# Patient Record
Sex: Female | Born: 1995 | Race: Black or African American | Hispanic: No
Health system: Southern US, Community
[De-identification: ages and names within clinical notes are randomized; demographics above are authoritative.]

## PROBLEM LIST (undated history)

## (undated) HISTORY — PX: NO PAST SURGERIES: SHX2092

---

## 2020-03-25 DIAGNOSIS — R87629 Unspecified abnormal cytological findings in specimens from vagina: Secondary | ICD-10-CM

## 2020-03-25 HISTORY — DX: Unspecified abnormal cytological findings in specimens from vagina: R87.629

## 2020-07-25 ENCOUNTER — Emergency Department (HOSPITAL_COMMUNITY): Payer: Self-pay

## 2020-07-25 ENCOUNTER — Other Ambulatory Visit: Payer: Self-pay

## 2020-07-25 ENCOUNTER — Emergency Department (HOSPITAL_COMMUNITY)
Admission: EM | Admit: 2020-07-25 | Discharge: 2020-07-25 | Disposition: A | Discharge status: Home / Self Care | Payer: Self-pay | Attending: Emergency Medicine | Admitting: Emergency Medicine

## 2020-07-25 DIAGNOSIS — T1490XA Injury, unspecified, initial encounter: Secondary | ICD-10-CM

## 2020-07-25 DIAGNOSIS — S61511A Laceration without foreign body of right wrist, initial encounter: Secondary | ICD-10-CM

## 2020-07-25 DIAGNOSIS — S51811A Laceration without foreign body of right forearm, initial encounter: Secondary | ICD-10-CM | POA: Insufficient documentation

## 2020-07-25 DIAGNOSIS — Y907 Blood alcohol level of 200-239 mg/100 ml: Secondary | ICD-10-CM | POA: Insufficient documentation

## 2020-07-25 DIAGNOSIS — F10129 Alcohol abuse with intoxication, unspecified: Secondary | ICD-10-CM | POA: Insufficient documentation

## 2020-07-25 DIAGNOSIS — Z20822 Contact with and (suspected) exposure to covid-19: Secondary | ICD-10-CM | POA: Insufficient documentation

## 2020-07-25 LAB — COMPREHENSIVE METABOLIC PANEL WITH GFR
ALT: 19 U/L (ref 0–44)
AST: 25 U/L (ref 15–41)
Albumin: 4.4 g/dL (ref 3.5–5.0)
Alkaline Phosphatase: 57 U/L (ref 38–126)
Anion gap: 14 (ref 5–15)
BUN: 6 mg/dL (ref 6–20)
CO2: 19 mmol/L — ABNORMAL LOW (ref 22–32)
Calcium: 9.2 mg/dL (ref 8.9–10.3)
Chloride: 108 mmol/L (ref 98–111)
Creatinine, Ser: 0.99 mg/dL (ref 0.44–1.00)
GFR, Estimated: 39 mL/min — ABNORMAL LOW
Glucose, Bld: 146 mg/dL — ABNORMAL HIGH (ref 70–99)
Potassium: 3.2 mmol/L — ABNORMAL LOW (ref 3.5–5.1)
Sodium: 141 mmol/L (ref 135–145)
Total Bilirubin: 0.7 mg/dL (ref 0.3–1.2)
Total Protein: 7.2 g/dL (ref 6.5–8.1)

## 2020-07-25 LAB — I-STAT CHEM 8, ED
BUN: 5 mg/dL — ABNORMAL LOW (ref 6–20)
Calcium, Ion: 0.99 mmol/L — ABNORMAL LOW (ref 1.15–1.40)
Chloride: 108 mmol/L (ref 98–111)
Creatinine, Ser: 1.2 mg/dL — ABNORMAL HIGH (ref 0.44–1.00)
Glucose, Bld: 141 mg/dL — ABNORMAL HIGH (ref 70–99)
HCT: 41 % (ref 36.0–46.0)
Hemoglobin: 13.9 g/dL (ref 12.0–15.0)
Potassium: 3.2 mmol/L — ABNORMAL LOW (ref 3.5–5.1)
Sodium: 143 mmol/L (ref 135–145)
TCO2: 16 mmol/L — ABNORMAL LOW (ref 22–32)

## 2020-07-25 LAB — URINALYSIS, ROUTINE W REFLEX MICROSCOPIC
Bilirubin Urine: NEGATIVE
Glucose, UA: NEGATIVE mg/dL
Ketones, ur: NEGATIVE mg/dL
Leukocytes,Ua: NEGATIVE
Nitrite: NEGATIVE
Protein, ur: NEGATIVE mg/dL
Specific Gravity, Urine: 1.014 (ref 1.005–1.030)
pH: 6 (ref 5.0–8.0)

## 2020-07-25 LAB — CBC
HCT: 42.5 % (ref 36.0–46.0)
Hemoglobin: 13.7 g/dL (ref 12.0–15.0)
MCH: 30.2 pg (ref 26.0–34.0)
MCHC: 32.2 g/dL (ref 30.0–36.0)
MCV: 93.8 fL (ref 80.0–100.0)
Platelets: 246 10*3/uL (ref 150–400)
RBC: 4.53 MIL/uL (ref 3.87–5.11)
RDW: 12.1 % (ref 11.5–15.5)
WBC: 5.4 10*3/uL (ref 4.0–10.5)
nRBC: 0 % (ref 0.0–0.2)

## 2020-07-25 LAB — RESPIRATORY PANEL BY RT PCR (FLU A&B, COVID)
Influenza A by PCR: NEGATIVE
Influenza B by PCR: NEGATIVE
SARS Coronavirus 2 by RT PCR: NEGATIVE

## 2020-07-25 LAB — SAMPLE TO BLOOD BANK

## 2020-07-25 LAB — PROTIME-INR
INR: 1 (ref 0.8–1.2)
Prothrombin Time: 12.7 seconds (ref 11.4–15.2)

## 2020-07-25 LAB — I-STAT BETA HCG BLOOD, ED (MC, WL, AP ONLY): I-stat hCG, quantitative: <5 m[IU]/mL (ref <5)

## 2020-07-25 LAB — ETHANOL: Alcohol, Ethyl (B): 222 mg/dL — ABNORMAL HIGH (ref <10)

## 2020-07-25 LAB — CK: Total CK: 290 U/L — ABNORMAL HIGH (ref 38–234)

## 2020-07-25 LAB — LACTIC ACID, PLASMA: Lactic Acid, Venous: 5.5 mmol/L (ref 0.5–1.9)

## 2020-07-25 MED ORDER — LACTATED RINGERS IV BOLUS
1000.0000 mL | Freq: Once | INTRAVENOUS | Status: AC
  Administered 2020-07-25: 1000 mL via INTRAVENOUS

## 2020-07-25 MED ORDER — IOHEXOL 350 MG/ML SOLN
100.0000 mL | Freq: Once | INTRAVENOUS | Status: AC | PRN
  Administered 2020-07-25: 100 mL via INTRAVENOUS

## 2020-07-25 MED ORDER — KETAMINE HCL 50 MG/5ML IJ SOSY
PREFILLED_SYRINGE | INTRAMUSCULAR | Status: AC
  Administered 2020-07-25: 50 mg
  Filled 2020-07-25: qty 5

## 2020-07-25 MED ORDER — KETAMINE HCL 50 MG/5ML IJ SOSY
PREFILLED_SYRINGE | INTRAMUSCULAR | Status: AC
  Filled 2020-07-25: qty 5

## 2020-07-25 MED ORDER — ZIPRASIDONE MESYLATE 20 MG IM SOLR
10.0000 mg | Freq: Once | INTRAMUSCULAR | Status: AC
  Administered 2020-07-25: 10 mg via INTRAMUSCULAR
  Filled 2020-07-25: qty 20

## 2020-07-25 MED ORDER — SODIUM CHLORIDE 0.9 % IV SOLN: Freq: Once | INTRAVENOUS | Status: AC
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
## 2020-07-25 NOTE — ED Notes (Signed)
Tourniquets removed from pt with MD at bedside. Bleeding controlled.

## 2020-07-25 NOTE — ED Notes (Signed)
Patient transported to CT 

## 2020-07-25 NOTE — ED Notes (Signed)
MD at bedside, suturing lacerations on R arm

## 2020-07-25 NOTE — ED Notes (Signed)
Per EMS, first tourniquet applied at 0145 by [employer]. Second tourniquet applied by EMS at 0205.

## 2020-08-01 ENCOUNTER — Encounter (HOSPITAL_COMMUNITY): Payer: Self-pay | Admitting: *Deleted

## 2020-08-01 ENCOUNTER — Other Ambulatory Visit: Payer: Self-pay

## 2020-08-01 ENCOUNTER — Ambulatory Visit (HOSPITAL_COMMUNITY)
Admission: EM | Admit: 2020-08-01 | Discharge: 2020-08-01 | Disposition: A | Discharge status: Home / Self Care | Payer: Self-pay | Attending: Family Medicine | Admitting: Family Medicine

## 2020-08-01 DIAGNOSIS — S4991XD Unspecified injury of right shoulder and upper arm, subsequent encounter: Secondary | ICD-10-CM

## 2020-08-01 DIAGNOSIS — R200 Anesthesia of skin: Secondary | ICD-10-CM

## 2020-08-01 DIAGNOSIS — S61511D Laceration without foreign body of right wrist, subsequent encounter: Secondary | ICD-10-CM

## 2020-08-01 LAB — POC URINE PREG, ED: Preg Test, Ur: NEGATIVE

## 2020-08-01 MED ORDER — PREDNISONE 20 MG PO TABS: 20.0000 mg | ORAL_TABLET | Freq: Two times a day (BID) | ORAL | 0 refills | Status: DC
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
## 2020-08-01 NOTE — Discharge Instructions (Signed)
Continue to keep area clean and dry.  Stitches need to come out on 08/04/2020 Call tomorrow to [facility].  Dr. Dorthula Nettles is on-call for hand injuries.  You need to be seen next week Take prednisone twice a day.  This is an anti-inflammatory to help take down swelling around the nerve Keep hand elevated as much as you are able

## 2020-08-07 ENCOUNTER — Other Ambulatory Visit (HOSPITAL_COMMUNITY): Payer: Self-pay

## 2020-08-11 ENCOUNTER — Encounter (HOSPITAL_BASED_OUTPATIENT_CLINIC_OR_DEPARTMENT_OTHER): Admission: RE | Payer: Self-pay | Source: Home / Self Care

## 2020-08-11 ENCOUNTER — Ambulatory Visit (HOSPITAL_BASED_OUTPATIENT_CLINIC_OR_DEPARTMENT_OTHER): Admission: RE | Admit: 2020-08-11 | Payer: 59 | Source: Home / Self Care | Admitting: Orthopedic Surgery

## 2020-08-11 SURGERY — REPAIR, TENDON, FLEXOR
Anesthesia: Monitor Anesthesia Care | Laterality: Right

## 2021-03-25 DIAGNOSIS — Z9189 Other specified personal risk factors, not elsewhere classified: Secondary | ICD-10-CM | POA: Insufficient documentation

## 2021-03-29 ENCOUNTER — Encounter (HOSPITAL_COMMUNITY): Payer: Self-pay

## 2021-03-29 ENCOUNTER — Emergency Department (HOSPITAL_COMMUNITY)
Admission: EM | Admit: 2021-03-29 | Discharge: 2021-03-29 | Disposition: A | Discharge status: Home / Self Care | Payer: 59 | Attending: Emergency Medicine | Admitting: Emergency Medicine

## 2021-03-29 ENCOUNTER — Other Ambulatory Visit: Payer: Self-pay

## 2021-03-29 DIAGNOSIS — T50901A Poisoning by unspecified drugs, medicaments and biological substances, accidental (unintentional), initial encounter: Secondary | ICD-10-CM

## 2021-03-29 DIAGNOSIS — R112 Nausea with vomiting, unspecified: Secondary | ICD-10-CM | POA: Insufficient documentation

## 2021-03-29 DIAGNOSIS — F17200 Nicotine dependence, unspecified, uncomplicated: Secondary | ICD-10-CM | POA: Insufficient documentation

## 2021-03-29 DIAGNOSIS — D72829 Elevated white blood cell count, unspecified: Secondary | ICD-10-CM | POA: Insufficient documentation

## 2021-03-29 DIAGNOSIS — T424X1A Poisoning by benzodiazepines, accidental (unintentional), initial encounter: Secondary | ICD-10-CM | POA: Insufficient documentation

## 2021-03-29 LAB — RAPID URINE DRUG SCREEN, HOSP PERFORMED
Amphetamines: NOT DETECTED
Barbiturates: NOT DETECTED
Benzodiazepines: NOT DETECTED
Cocaine: NOT DETECTED
Opiates: NOT DETECTED
Tetrahydrocannabinol: POSITIVE — AB

## 2021-03-29 LAB — COMPREHENSIVE METABOLIC PANEL
ALT: 19 U/L (ref 0–44)
AST: 29 U/L (ref 15–41)
Albumin: 4.8 g/dL (ref 3.5–5.0)
Alkaline Phosphatase: 76 U/L (ref 38–126)
Anion gap: 14 (ref 5–15)
BUN: 9 mg/dL (ref 6–20)
CO2: 17 mmol/L — ABNORMAL LOW (ref 22–32)
Calcium: 10 mg/dL (ref 8.9–10.3)
Chloride: 104 mmol/L (ref 98–111)
Creatinine, Ser: 1.15 mg/dL — ABNORMAL HIGH (ref 0.44–1.00)
GFR, Estimated: >60 mL/min (ref >60)
Glucose, Bld: 183 mg/dL — ABNORMAL HIGH (ref 70–99)
Potassium: 3.4 mmol/L — ABNORMAL LOW (ref 3.5–5.1)
Sodium: 135 mmol/L (ref 135–145)
Total Bilirubin: 2.6 mg/dL — ABNORMAL HIGH (ref 0.3–1.2)
Total Protein: 7.8 g/dL (ref 6.5–8.1)

## 2021-03-29 LAB — CBC WITH DIFFERENTIAL/PLATELET
Abs Immature Granulocytes: 0.08 10*3/uL — ABNORMAL HIGH (ref 0.00–0.07)
Basophils Absolute: 0.1 10*3/uL (ref 0.0–0.1)
Basophils Relative: 0 %
Eosinophils Absolute: 0 10*3/uL (ref 0.0–0.5)
Eosinophils Relative: 0 %
HCT: 45.9 % (ref 36.0–46.0)
Hemoglobin: 15.4 g/dL — ABNORMAL HIGH (ref 12.0–15.0)
Immature Granulocytes: 1 %
Lymphocytes Relative: 9 %
Lymphs Abs: 1.1 10*3/uL (ref 0.7–4.0)
MCH: 30.3 pg (ref 26.0–34.0)
MCHC: 33.6 g/dL (ref 30.0–36.0)
MCV: 90.2 fL (ref 80.0–100.0)
Monocytes Absolute: 1.1 10*3/uL — ABNORMAL HIGH (ref 0.1–1.0)
Monocytes Relative: 9 %
Neutro Abs: 10 10*3/uL — ABNORMAL HIGH (ref 1.7–7.7)
Neutrophils Relative %: 81 %
Platelets: 285 10*3/uL (ref 150–400)
RBC: 5.09 MIL/uL (ref 3.87–5.11)
RDW: 12.1 % (ref 11.5–15.5)
WBC: 12.3 10*3/uL — ABNORMAL HIGH (ref 4.0–10.5)
nRBC: 0 % (ref 0.0–0.2)

## 2021-03-29 LAB — BASIC METABOLIC PANEL
Anion gap: 10 (ref 5–15)
BUN: 6 mg/dL (ref 6–20)
CO2: 18 mmol/L — ABNORMAL LOW (ref 22–32)
Calcium: 8.9 mg/dL (ref 8.9–10.3)
Chloride: 109 mmol/L (ref 98–111)
Creatinine, Ser: 0.77 mg/dL (ref 0.44–1.00)
GFR, Estimated: >60 mL/min (ref >60)
Glucose, Bld: 86 mg/dL (ref 70–99)
Potassium: 4 mmol/L (ref 3.5–5.1)
Sodium: 137 mmol/L (ref 135–145)

## 2021-03-29 LAB — I-STAT BETA HCG BLOOD, ED (MC, WL, AP ONLY): I-stat hCG, quantitative: <5 m[IU]/mL (ref <5)

## 2021-03-29 LAB — MAGNESIUM: Magnesium: 2.1 mg/dL (ref 1.7–2.4)

## 2021-03-29 LAB — SALICYLATE LEVEL: Salicylate Lvl: <7 mg/dL — ABNORMAL LOW (ref 7.0–30.0)

## 2021-03-29 LAB — ETHANOL: Alcohol, Ethyl (B): <10 mg/dL (ref <10)

## 2021-03-29 LAB — ACETAMINOPHEN LEVEL: Acetaminophen (Tylenol), Serum: <10 ug/mL — ABNORMAL LOW (ref 10–30)

## 2021-03-29 MED ORDER — SODIUM CHLORIDE 0.9 % IV BOLUS
1000.0000 mL | Freq: Once | INTRAVENOUS | Status: AC
  Administered 2021-03-29: 18:00:00 1000 mL via INTRAVENOUS

## 2021-03-29 MED ORDER — ONDANSETRON HCL 4 MG/2ML IJ SOLN
4.0000 mg | Freq: Once | INTRAMUSCULAR | Status: AC
  Administered 2021-03-29: 18:00:00 4 mg via INTRAVENOUS
  Filled 2021-03-29: qty 2

## 2021-03-29 MED ORDER — POTASSIUM CHLORIDE 10 MEQ/100ML IV SOLN
10.0000 meq | Freq: Once | INTRAVENOUS | Status: AC
  Administered 2021-03-29: 18:00:00 10 meq via INTRAVENOUS
  Filled 2021-03-29: qty 100

## 2021-03-29 NOTE — ED Provider Notes (Addendum)
Emergency Medicine Provider Triage Evaluation Note  Lacey Melendez , a 25 y.o. female  was evaluated in triage.  Pt complains of possible overdose after taking a handful of Lexapro which she is currently on for depression and anxiety.  Symptoms include weakness, nausea, vomiting, "eyes are numb "..  Review of Systems  Positive: Vomiting, nausea, weakness Negative: Chest pain  Physical Exam  There were no vitals taken for this visit. Gen:   Awake, no distress   Resp:  Normal effort  MSK:   Moves extremities without difficulty  Other:    Medical Decision Making  Medically screening exam initiated at 1:20 PM.  Appropriate orders placed.  Lacey Melendez was informed that the remainder of the evaluation will be completed by another provider, this initial triage assessment does not replace that evaluation, and the importance of remaining in the ED until their evaluation is complete.  Will need to call poison control. EKG ordered.    Claude Manges, PA-C 03/29/21 1323    [address], PA-C 03/29/21 1327    Milagros Loll, MD 03/30/21 [phone]

## 2021-03-29 NOTE — ED Notes (Signed)
Spoke with poison control. They are recommending a repeat BMP and EKG.

## 2021-03-29 NOTE — ED Notes (Signed)
Pt ambulated to restroom w/o incident

## 2021-03-29 NOTE — ED Triage Notes (Addendum)
Patient arrived by [facility] with complaint of taking escitalopram(lexapro) as an accident and thought she took a handful of candy. Patient alert and oriented. Has vomited x 1 pta with pill fragments in bag. Patient has no idea how many pills she took.

## 2021-03-29 NOTE — ED Provider Notes (Signed)
[facility] EMERGENCY DEPARTMENT Provider Note   CSN: [id number] Arrival date & time: 03/29/21  1250     History No chief complaint on file.   Lacey Melendez is a 25 y.o. female who presents for evaluation of possible overdose.  Patient reports that about 1230 this afternoon, she took a handful of her Lexapro.  Patient reports she has the pills in a day and she thought it was candy.  She states she grabbed a handful and took them.  She states she takes 10 mg Lexapro.  She thinks she may have taken about 20.  She states that this was accidental and she was not trying to hurt or kill herself.  She does report smoking marijuana earlier this morning.  No alcohol, cocaine, other drug use.  She reports that after this happened, she had a few episodes of nausea/vomiting.  She states her abdomen has felt "queasy."  No chest pain, trouble breathing.  She is not any difficulty moving her legs.  The history is provided by the patient.      History reviewed. No pertinent past medical history.  There are no problems to display for this patient.   History reviewed. No pertinent surgical history.   OB History   No obstetric history on file.     No family history on file.  Social History   Tobacco Use   Smoking status: Every Day    Pack years: 0.00   Smokeless tobacco: Never  Vaping Use   Vaping Use: Some days  Substance Use Topics   Drug use: Yes    Types: Marijuana    Comment: EVERY DAY    Home Medications Prior to Admission medications   Medication Sig Start Date End Date Taking? Authorizing Provider  predniSONE (DELTASONE) 20 MG tablet Take 1 tablet (20 mg total) by mouth 2 (two) times daily with a meal. 08/01/20   Eustace Moore, MD    Allergies    Patient has no known allergies.  Review of Systems   Review of Systems  Constitutional:  Negative for fever.  Respiratory:  Negative for cough and shortness of breath.   Cardiovascular:  Negative for  chest pain.  Gastrointestinal:  Positive for nausea and vomiting. Negative for abdominal pain.  Genitourinary:  Negative for dysuria and hematuria.  Neurological:  Negative for weakness, numbness and headaches.  Psychiatric/Behavioral:  Negative for suicidal ideas.   All other systems reviewed and are negative.  Physical Exam Updated Vital Signs BP 114/78 (BP Location: Right Arm)   Pulse 61   Temp 98.2 F (36.8 C) (Oral)   Resp 16   Ht 5\' 5"  (1.651 m)   Wt 59.9 kg   SpO2 100%   BMI 21.97 kg/m   Physical Exam Vitals and nursing note reviewed.  Constitutional:      Appearance: Normal appearance. She is well-developed.  HENT:     Head: Normocephalic and atraumatic.  Eyes:     General: Lids are normal.     Conjunctiva/sclera: Conjunctivae normal.     Pupils: Pupils are equal, round, and reactive to light.  Cardiovascular:     Rate and Rhythm: Normal rate and regular rhythm.     Pulses: Normal pulses.     Heart sounds: Normal heart sounds. No murmur heard.   No friction rub. No gallop.  Pulmonary:     Effort: Pulmonary effort is normal.     Breath sounds: Normal breath sounds.     Comments:  Lungs clear to auscultation bilaterally.  Symmetric chest rise.  No wheezing, rales, rhonchi. Abdominal:     Palpations: Abdomen is soft. Abdomen is not rigid.     Tenderness: There is no abdominal tenderness. There is no guarding.     Comments: Abdomen is soft, non-distended, non-tender. No rigidity, No guarding. No peritoneal signs.  Musculoskeletal:        General: Normal range of motion.     Cervical back: Full passive range of motion without pain.  Skin:    General: Skin is warm and dry.     Capillary Refill: Capillary refill takes less than 2 seconds.  Neurological:     Mental Status: She is alert and oriented to person, place, and time.  Psychiatric:        Speech: Speech normal.    ED Results / Procedures / Treatments   Labs (all labs ordered are listed, but only  abnormal results are displayed) Labs Reviewed  CBC WITH DIFFERENTIAL/PLATELET - Abnormal; Notable for the following components:      Result Value   WBC 12.3 (*)    Hemoglobin 15.4 (*)    Neutro Abs 10.0 (*)    Monocytes Absolute 1.1 (*)    Abs Immature Granulocytes 0.08 (*)    All other components within normal limits  COMPREHENSIVE METABOLIC PANEL - Abnormal; Notable for the following components:   Potassium 3.4 (*)    CO2 17 (*)    Glucose, Bld 183 (*)    Creatinine, Ser 1.15 (*)    Total Bilirubin 2.6 (*)    All other components within normal limits  RAPID URINE DRUG SCREEN, HOSP PERFORMED - Abnormal; Notable for the following components:   Tetrahydrocannabinol POSITIVE (*)    All other components within normal limits  SALICYLATE LEVEL - Abnormal; Notable for the following components:   Salicylate Lvl <7.0 (*)    All other components within normal limits  ACETAMINOPHEN LEVEL - Abnormal; Notable for the following components:   Acetaminophen (Tylenol), Serum <10 (*)    All other components within normal limits  ETHANOL  MAGNESIUM  CBC WITH DIFFERENTIAL/PLATELET  I-STAT BETA HCG BLOOD, ED ([facility], [facility], [facility] ONLY)  CBG MONITORING, ED    EKG EKG Interpretation  Date/Time:  Tuesday March 29 2021 13:26:03 EDT Ventricular Rate:  86 PR Interval:  138 QRS Duration: 72 QT Interval:  398 QTC Calculation: 476 R Axis:   32 Text Interpretation: Normal sinus rhythm Right atrial enlargement Borderline ECG No old tracing to compare Confirmed by Jacalyn Lefevre [phone]) on 03/29/2021 4:54:56 PM  Radiology No results found.  Procedures Procedures   Medications Ordered in ED Medications  potassium chloride 10 mEq in 100 mL IVPB (10 mEq Intravenous New Bag/Given 03/29/21 1744)  ondansetron (ZOFRAN) injection 4 mg (4 mg Intravenous Given 03/29/21 1741)  sodium chloride 0.9 % bolus 1,000 mL (1,000 mLs Intravenous New Bag/Given 03/29/21 1741)    ED Course  I have reviewed the triage vital signs  and the nursing notes.  Pertinent labs & imaging results that were available during my care of the patient were reviewed by me and considered in my medical decision making (see chart for details).  Clinical Course as of 03/29/21 1846  Tue Mar 29, 2021  1654 Potassium(!): 3.4 [LL]    Clinical Course User Index [LL] Rosana Hoes   MDM Rules/Calculators/A&P  25 year old female who presents for evaluation of possible overdose.  Patient reports that about 12:30 PM, she took a handful of her Lexapro that she thought was candy and ingested them.  She states that this was accidental and she was not having any SI.  She states she takes 10 mg Lexapro and states that she may have taken about 20 of them.  On initial arrival, she is afebrile, toxic appearing.  Vital signs are stable.  Exam is reassuring.  Patient tells me that this was accidental and she does not have any SI.  I cannot see any Lexapro listed on her medication list but patient states she takes it for anxiety/depression.  CBC shows slight leukocytosis of 12.3.  Hemoglobin is 15.4.  I-STAT beta is negative.  CMP shows potassium 3.4, bicarb of 17, glucose of 183, creatinine of 1.15.  Ethanol, salicylate, acetaminophen level are normal.  UDS is positive for marijuana.  Discussed patient with Misty Stanley [facility]) who states that patient took less than 300 mg, she will monitor for 10 hours.  The patient took more than 300 mg, they recommend observation for 24 hours.  They would like a magnesium level checked given her potassium is 3.4 as well as potassium repletion here in the ED.  They recommend IV fluids, supportive care measures.  Patient will need a repeat EKG prior to discharge.  She states the main concerns for Lexapro overdose are QTC prolongation, QRS prolongation, CNS depression, nausea/vomiting.  Discussed plan with patient.  Patient thinks that she only took 20.  Will monitor for 10 hours.  Patient  signed out to Dr. Particia Nearing with continued observation.   Portions of this note were generated with Scientist, clinical (histocompatibility and immunogenetics). Dictation errors may occur despite best attempts at proofreading.    Final Clinical Impression(s) / ED Diagnoses Final diagnoses:  Accidental overdose, initial encounter    Rx / DC Orders ED Discharge Orders     None        Rosana Hoes 03/29/21 1846    Jacalyn Lefevre, MD 03/29/21 2307

## 2021-03-29 NOTE — ED Notes (Signed)
Pt wants labs to be drawn off IV.

## 2021-08-02 ENCOUNTER — Other Ambulatory Visit: Payer: Self-pay

## 2021-08-02 ENCOUNTER — Encounter: Payer: Self-pay | Admitting: Obstetrics and Gynecology

## 2021-08-02 ENCOUNTER — Ambulatory Visit (INDEPENDENT_AMBULATORY_CARE_PROVIDER_SITE_OTHER): Payer: Self-pay | Admitting: Obstetrics and Gynecology

## 2021-08-02 ENCOUNTER — Other Ambulatory Visit (HOSPITAL_COMMUNITY)
Admission: RE | Admit: 2021-08-02 | Discharge: 2021-08-02 | Disposition: A | Discharge status: Home / Self Care | Payer: Medicaid Other | Source: Ambulatory Visit | Attending: Obstetrics and Gynecology | Admitting: Obstetrics and Gynecology

## 2021-08-02 DIAGNOSIS — Z3400 Encounter for supervision of normal first pregnancy, unspecified trimester: Secondary | ICD-10-CM | POA: Insufficient documentation

## 2021-08-02 DIAGNOSIS — Z3403 Encounter for supervision of normal first pregnancy, third trimester: Secondary | ICD-10-CM | POA: Insufficient documentation

## 2021-08-02 DIAGNOSIS — O99322 Drug use complicating pregnancy, second trimester: Secondary | ICD-10-CM

## 2021-08-02 DIAGNOSIS — Z3401 Encounter for supervision of normal first pregnancy, first trimester: Secondary | ICD-10-CM | POA: Insufficient documentation

## 2021-08-02 DIAGNOSIS — O99320 Drug use complicating pregnancy, unspecified trimester: Secondary | ICD-10-CM | POA: Insufficient documentation

## 2021-08-02 DIAGNOSIS — Z9189 Other specified personal risk factors, not elsewhere classified: Secondary | ICD-10-CM

## 2021-08-02 MED ORDER — BLOOD PRESSURE MONITOR MISC: 0 refills | Status: AC

## 2021-08-02 NOTE — Patient Instructions (Signed)

## 2021-08-02 NOTE — Progress Notes (Signed)
  Subjective:    Lacey Melendez is a G1P0 at [redacted]w[redacted]d being seen today for her first obstetrical visit.  Her obstetrical history is significant for late onset to prenatal care. Patient does intend to breast feed. Pregnancy history fully reviewed.  Patient reports no complaints.  Vitals:   08/02/21 1430  BP: 112/73  Pulse: 80  Weight: 134 lb (60.8 kg)    HISTORY: OB History  Gravida Para Term Preterm AB Living  1            SAB IAB Ectopic Multiple Live Births               # Outcome Date GA Lbr Len/2nd Weight Sex Delivery Anes PTL Lv  1 Current            Past Medical History:  Diagnosis Date  . Vaginal Pap smear, abnormal 03/2020   high hpv   History reviewed. No pertinent surgical history. Family History  Problem Relation Age of Onset  . Hyperlipidemia Maternal Grandmother   . Diabetes Paternal Grandmother      Exam    Uterus:     Pelvic Exam:    Perineum: No Hemorrhoids, Normal Perineum   Vulva: normal   Vagina:  normal mucosa, normal discharge   pH:    Cervix: multiparous appearance and cervix is closed and long   Adnexa: normal adnexa and no mass, fullness, tenderness   Bony Pelvis: gynecoid  System: Breast:  normal appearance, no masses or tenderness   Skin: normal coloration and turgor, no rashes    Neurologic: oriented, no focal deficits   Extremities: normal strength, tone, and muscle mass   HEENT extra ocular movement intact   Mouth/Teeth mucous membranes moist, pharynx normal without lesions and dental hygiene good   Neck supple and no masses   Cardiovascular: regular rate and rhythm   Respiratory:  appears well, vitals normal, no respiratory distress, acyanotic, normal RR, chest clear, no wheezing, crepitations, rhonchi, normal symmetric air entry   Abdomen: soft, non-tender; bowel sounds normal; no masses,  no organomegaly   Urinary:       Assessment:    Pregnancy: G1P0 Patient Active Problem List   Diagnosis Date Noted  . Supervision of  normal first pregnancy, antepartum 08/02/2021  . Drug use affecting pregnancy 08/02/2021  . Encounter for supervision of normal first pregnancy in first trimester 08/02/2021  . History of drug overdose 03/25/2021        Plan:     Initial labs drawn. Prenatal vitamins. Problem list reviewed and updated. Genetic Screening discussed : panorama deferred.  Ultrasound discussed; fetal survey: ordered.  Follow up in 4 weeks. 50% of 30 min visit spent on counseling and coordination of care.     Nguyet Mercer 08/02/2021

## 2021-08-03 LAB — CERVICOVAGINAL ANCILLARY ONLY
Bacterial Vaginitis (gardnerella): NEGATIVE
Candida Glabrata: NEGATIVE
Candida Vaginitis: NEGATIVE
Chlamydia: NEGATIVE
Comment: NEGATIVE
Comment: NEGATIVE
Comment: NEGATIVE
Comment: NEGATIVE
Comment: NEGATIVE
Comment: NORMAL
Neisseria Gonorrhea: NEGATIVE
Trichomonas: NEGATIVE

## 2021-08-04 LAB — CYTOLOGY - PAP: Diagnosis: NEGATIVE

## 2021-08-04 LAB — CULTURE, OB URINE

## 2021-08-04 LAB — URINE CULTURE, OB REFLEX: Organism ID, Bacteria: NO GROWTH

## 2021-08-05 LAB — OBSTETRIC PANEL, INCLUDING HIV
Antibody Screen: NEGATIVE
Basophils Absolute: 0 10*3/uL (ref 0.0–0.2)
Basos: 0 %
EOS (ABSOLUTE): 0.1 10*3/uL (ref 0.0–0.4)
Eos: 1 %
HIV Screen 4th Generation wRfx: NONREACTIVE
Hematocrit: 36.8 % (ref 34.0–46.6)
Hemoglobin: 12.7 g/dL (ref 11.1–15.9)
Hepatitis B Surface Ag: NEGATIVE
Immature Grans (Abs): 0 10*3/uL (ref 0.0–0.1)
Immature Granulocytes: 1 %
Lymphocytes Absolute: 1.1 10*3/uL (ref 0.7–3.1)
Lymphs: 14 %
MCH: 30.3 pg (ref 26.6–33.0)
MCHC: 34.5 g/dL (ref 31.5–35.7)
MCV: 88 fL (ref 79–97)
Monocytes Absolute: 0.6 10*3/uL (ref 0.1–0.9)
Monocytes: 7 %
Neutrophils Absolute: 5.8 10*3/uL (ref 1.4–7.0)
Neutrophils: 77 %
Platelets: 228 10*3/uL (ref 150–450)
RBC: 4.19 x10E6/uL (ref 3.77–5.28)
RDW: 12.5 % (ref 11.7–15.4)
RPR Ser Ql: NONREACTIVE
Rh Factor: POSITIVE
Rubella Antibodies, IGG: 8.4 index (ref >0.99)
WBC: 7.6 10*3/uL (ref 3.4–10.8)

## 2021-08-24 ENCOUNTER — Ambulatory Visit: Payer: Medicaid Other | Attending: Obstetrics and Gynecology

## 2021-08-24 ENCOUNTER — Other Ambulatory Visit: Payer: Self-pay

## 2021-08-24 DIAGNOSIS — Z363 Encounter for antenatal screening for malformations: Secondary | ICD-10-CM | POA: Insufficient documentation

## 2021-08-24 DIAGNOSIS — Z3400 Encounter for supervision of normal first pregnancy, unspecified trimester: Secondary | ICD-10-CM

## 2021-08-24 DIAGNOSIS — Z3402 Encounter for supervision of normal first pregnancy, second trimester: Secondary | ICD-10-CM | POA: Insufficient documentation

## 2021-08-25 ENCOUNTER — Other Ambulatory Visit: Payer: Self-pay | Admitting: *Deleted

## 2021-08-25 DIAGNOSIS — Z362 Encounter for other antenatal screening follow-up: Secondary | ICD-10-CM

## 2021-08-30 ENCOUNTER — Encounter: Payer: Self-pay | Admitting: Nurse Practitioner

## 2021-08-30 ENCOUNTER — Other Ambulatory Visit: Payer: Self-pay

## 2021-08-30 ENCOUNTER — Ambulatory Visit (INDEPENDENT_AMBULATORY_CARE_PROVIDER_SITE_OTHER): Payer: Medicaid Other | Admitting: Nurse Practitioner

## 2021-08-30 VITALS — BP 103/64 | HR 83 | Wt 140.0 lb

## 2021-08-30 DIAGNOSIS — O26899 Other specified pregnancy related conditions, unspecified trimester: Secondary | ICD-10-CM

## 2021-08-30 DIAGNOSIS — G5600 Carpal tunnel syndrome, unspecified upper limb: Secondary | ICD-10-CM

## 2021-08-30 DIAGNOSIS — Z3A21 21 weeks gestation of pregnancy: Secondary | ICD-10-CM

## 2021-08-30 DIAGNOSIS — Z3401 Encounter for supervision of normal first pregnancy, first trimester: Secondary | ICD-10-CM

## 2021-08-30 DIAGNOSIS — Z9189 Other specified personal risk factors, not elsewhere classified: Secondary | ICD-10-CM

## 2021-08-30 NOTE — Progress Notes (Signed)
Pt is having carpal like symptoms, recommend wrist spints/Tylenol.

## 2021-08-30 NOTE — Progress Notes (Signed)
    Subjective:  Lacey Melendez is a 25 y.o. G1P0 at [redacted]w[redacted]d being seen today for ongoing prenatal care.  She is currently monitored for the following issues for this low-risk pregnancy and has Supervision of normal first pregnancy, antepartum; Drug use affecting pregnancy; Encounter for supervision of normal first pregnancy in first trimester; and History of drug overdose on their problem list.  Patient reports  hands asleep on awakening .  Contractions: Not present. Vag. Bleeding: None.  Movement: Present. Denies leaking of fluid.   The following portions of the patient's history were reviewed and updated as appropriate: allergies, current medications, past family history, past medical history, past social history, past surgical history and problem list. Problem list updated.  Objective:   Vitals:   08/30/21 1328  BP: 103/64  Pulse: 83  Weight: 140 lb (63.5 kg)    Fetal Status: Fetal Heart Rate (bpm): 145   Movement: Present     General:  Alert, oriented and cooperative. Patient is in no acute distress.  Skin: Skin is warm and dry. No rash noted.   Cardiovascular: Normal heart rate noted  Respiratory: Normal respiratory effort, no problems with respiration noted  Abdomen: Soft, gravid, appropriate for gestational age. Pain/Pressure: Absent     Pelvic:  Cervical exam deferred        Extremities: Normal range of motion.     Mental Status: Normal mood and affect. Normal behavior. Normal judgment and thought content.   Urinalysis:      Assessment and Plan:  Pregnancy: G1P0 at [redacted]w[redacted]d  1. Encounter for supervision of normal first pregnancy in first trimester Reviewed EDC confirmed by MFM Drawing genetic testing and AFP today Will pick up BP cuff today - advised to take BP once a week and enter into Babyscripts Reviewed BP that is too high  Feeling baby move infrequently - advised she will feel daily movement by 24-28 weeks  - AFP, Serum, Open Spina Bifida  2. Carpal tunnel  syndrome during pregnancy Recommended hard wrist splints at night  3. History of drug overdose Does not see therapist who prescribed Lexapro.  Advised with a previous history, follow up in pregnancy is recommended to connect with [facility] in case problems arise in pregnancy or after birth.  - Ambulatory referral to Integrated [facility]  4. [redacted] weeks gestation of pregnancy   Preterm labor symptoms and general obstetric precautions including but not limited to vaginal bleeding, contractions, leaking of fluid and fetal movement were reviewed in detail with the patient. Please refer to After Visit Summary for other counseling recommendations.  Return in about 4 weeks (around 09/27/2021) for virtual or in person ROB.  Nolene Bernheim, RN, MSN, NP-BC Nurse Practitioner, [facility] for [employer], [facility] 08/30/2021 2:07 PM

## 2021-09-01 LAB — AFP, SERUM, OPEN SPINA BIFIDA
AFP MoM: 0.91
AFP Value: 73 ng/mL
Gest. Age on Collection Date: 21.2 weeks
Maternal Age At EDD: 27.7 yr
OSBR Risk 1 IN: 10000
Test Results:: NEGATIVE
Weight: 140 [lb_av]

## 2021-09-06 ENCOUNTER — Encounter: Payer: Self-pay | Admitting: Nurse Practitioner

## 2021-09-08 ENCOUNTER — Encounter: Payer: Self-pay | Admitting: Nurse Practitioner

## 2021-09-12 ENCOUNTER — Encounter: Payer: Self-pay | Admitting: Obstetrics and Gynecology

## 2021-09-22 ENCOUNTER — Ambulatory Visit: Payer: Medicaid Other | Attending: Obstetrics

## 2021-09-22 ENCOUNTER — Ambulatory Visit: Payer: Medicaid Other | Admitting: *Deleted

## 2021-09-22 ENCOUNTER — Other Ambulatory Visit: Payer: Self-pay

## 2021-09-22 VITALS — BP 114/56 | HR 83

## 2021-09-22 DIAGNOSIS — F1210 Cannabis abuse, uncomplicated: Secondary | ICD-10-CM | POA: Insufficient documentation

## 2021-09-22 DIAGNOSIS — Z3A24 24 weeks gestation of pregnancy: Secondary | ICD-10-CM

## 2021-09-22 DIAGNOSIS — O99322 Drug use complicating pregnancy, second trimester: Secondary | ICD-10-CM | POA: Diagnosis not present

## 2021-09-22 DIAGNOSIS — Z362 Encounter for other antenatal screening follow-up: Secondary | ICD-10-CM | POA: Diagnosis not present

## 2021-09-25 NOTE — L&D Delivery Note (Signed)
Delivery Note ?Lacey Melendez is a 26 y.o. G1P0 at [redacted]w[redacted]d admitted for active labor.  ? ?GBS Status:  Negative/-- (03/29 1458) ?Maximum Maternal Temperature: 98.6 ? ?Labor course: Initial SVE: 6/80/-1. Augmentation with: N/A. She then progressed to complete.  ?ROM: rupture date, rupture time, delivery date, or delivery time have not been documented with clear fluid ? ?Birth: At 1257 a viable female was delivered via spontaneous vaginal delivery (Presentation: cephalic;ROA). Nuchal cord present: Yes, reduced and delivered through. Shoulders and body delivered in usual fashion. Infant placed directly on mom's abdomen for bonding/skin-to-skin, baby dried and stimulated. Cord clamped x 2 after 1 minute and cut by FOB-Byron. Cord blood collected.  The placenta separated spontaneously and delivered via gentle cord traction. Pitocin infused rapidly IV per protocol.  Fundus firm with massage.  ?Placenta inspected and appears to be intact with a 3 VC.  Placenta/Cord with the following complications: n/a. Cord pH: n/a ?Sponge and instrument count were correct x2. ? ?Intrapartum complications:  None ?Anesthesia:  none ?Episiotomy: none ?Lacerations:  1st degree, hemostatic not repaired ?Suture Repair:  n/a ?EBL (mL): 100 ? ? ?Infant: ?APGAR (1 MIN): 9   ?APGAR (5 MINS): 9    ?Infant weight: pending ? ?Mom to postpartum.  Baby to Couplet care / Skin to Skin. Placenta to L&D   ?Plans to Breast and bottlefeed ?Contraception: Nexplanon ?Circumcision: N/A ? ?Note sent to [facility] for pp visit. ? ? ?Renee Harder CNM ?12/30/2021 ?1:41 PM ? ? ? ?

## 2021-09-26 ENCOUNTER — Telehealth: Payer: Medicaid Other | Admitting: Physician Assistant

## 2021-09-26 DIAGNOSIS — J069 Acute upper respiratory infection, unspecified: Secondary | ICD-10-CM

## 2021-09-26 NOTE — Patient Instructions (Signed)
Lacey Melendez, thank you for joining Mar Daring, PA-C for today's virtual visit.  While this provider is not your primary care provider (PCP), if your PCP is located in our provider database this encounter information will be shared with them immediately following your visit.  Consent: (Patient) Lacey Melendez provided verbal consent for this virtual visit at the beginning of the encounter.  Current Medications:  Current Outpatient Medications:    Blood Pressure Monitor MISC, Please  check blood pressure 1-2 times per week, Disp: 1 each, Rfl: 0   prenatal vitamin w/FE, FA (PRENATAL 1 + 1) 27-1 MG TABS tablet, Take 1 tablet by mouth daily at 12 noon., Disp: , Rfl:    Medications ordered in this encounter:  No orders of the defined types were placed in this encounter.    *If you need refills on other medications prior to your next appointment, please contact your pharmacy*  Follow-Up: Call back or seek an in-person evaluation if the symptoms worsen or if the condition fails to improve as anticipated.  Other Instructions Common Medications Safe in Pregnancy  Acne:      Constipation:  Benzoyl Peroxide     Colace  Clindamycin      Dulcolax Suppository  Topica Erythromycin     Fibercon  Salicylic Acid      Metamucil         Miralax AVOID:        Senakot   Accutane    Cough:  Retin-A       Cough Drops  Tetracycline      Phenergan w/ Codeine if Rx  Minocycline      Robitussin (Plain & DM)  Antibiotics:     Crabs/Lice:  Ceclor       RID  Cephalosporins    AVOID:  E-Mycins      Kwell  Keflex  Macrobid/Macrodantin   Diarrhea:  Penicillin      Kao-Pectate  Zithromax      Imodium AD         PUSH FLUIDS AVOID:       Cipro     Fever:  Tetracycline      Tylenol (Regular or Extra  Minocycline       Strength)  Levaquin      Extra Strength-Do not          Exceed 8 tabs/24 hrs Caffeine:        <226m/day (equiv. To 1 cup of coffee or  approx. 3 12 oz  sodas)         Gas: Cold/Hayfever:       Gas-X  Benadryl      Mylicon  Claritin       Phazyme  **Claritin-D        Chlor-Trimeton    Headaches:  Dimetapp      ASA-Free Excedrin  Drixoral-Non-Drowsy     Cold Compress  Mucinex (Guaifenasin)     Tylenol (Regular or Extra  Sudafed/Sudafed-12 Hour     Strength)  **Sudafed PE Pseudoephedrine   Tylenol Cold & Sinus     Vicks Vapor Rub  Zyrtec  **AVOID if Problems With Blood Pressure         Heartburn: Avoid lying down for at least 1 hour after meals  Aciphex      Maalox     Rash:  Milk of Magnesia     Benadryl    Mylanta       1% Hydrocortisone Cream  Pepcid  Complete   Sleep Aids: ° Prevacid      Ambien  ° Prilosec       Benadryl ° Rolaids       Chamomile Tea ° Tums (Limit 4/day)     Unisom °        Tylenol PM °        Warm milk-add vanilla or  °Hemorrhoids:       Sugar for taste ° Anusol/Anusol H.C. ° (RX: Analapram 2.5%)  Sugar Substitutes: ° Hydrocortisone OTC     Ok in moderation ° Preparation H     ° Tucks       ° Vaseline lotion applied to tissue with wiping   ° °Herpes:     Throat: ° Acyclovir      Oragel ° Famvir ° Valtrex     Vaccines: °        Flu Shot °Leg Cramps:       *Gardasil ° Benadryl      Hepatitis A °        Hepatitis B °Nasal Spray:       Pneumovax ° Saline Nasal Spray     Polio Booster °        Tetanus °Nausea:       Tuberculosis test or PPD ° Vitamin B6 25 mg TID   AVOID:   ° Dramamine      *Gardasil ° Emetrol       Live Poliovirus ° Ginger Root 250 mg QID    MMR (measles, mumps & ° High Complex Carbs @ Bedtime    rebella) ° Sea Bands-Accupressure    Varicella (Chickenpox) ° Unisom 1/2 tab TID     *No known complications  °         If received before °Pain:         Known pregnancy;  ° Darvocet       Resume series after ° Lortab        Delivery ° Percocet    Yeast:  ° Tramadol      Femstat ° Tylenol 3      Gyne-lotrimin ° Ultram       Monistat ° Vicodin     °      MISC: °        All Sunscreens   °        Hair  Coloring/highlights  °        Insect Repellant's °         (Including DEET) °        Mystic Tans ° ° ° °If you have been instructed to have an in-person evaluation today at a local Urgent Care facility, please use the link below. It will take you to a list of all of our available [facility], including address, phone number and hours of operation. Please do not delay care.  °[facility] ° °If you or a family member do not have a primary care provider, use the link below to schedule a visit and establish care. When you choose a [facility] primary care physician or advanced practice provider, you gain a long-term partner in health. °Find a Primary Care Provider ° °Learn more about [facility] in-office and virtual care options: °[facility] - Get Care Now °

## 2021-09-26 NOTE — Progress Notes (Signed)
Virtual Visit Consent   Lacey Melendez, you are scheduled for a virtual visit with a [facility] provider today.     Just as with appointments in the office, your consent must be obtained to participate.  Your consent will be active for this visit and any virtual visit you may have with one of our providers in the next 365 days.     If you have a MyChart account, a copy of this consent can be sent to you electronically.  All virtual visits are billed to your insurance company just like a traditional visit in the office.    As this is a virtual visit, video technology does not allow for your provider to perform a traditional examination.  This may limit your provider's ability to fully assess your condition.  If your provider identifies any concerns that need to be evaluated in person or the need to arrange testing (such as labs, EKG, etc.), we will make arrangements to do so.     Although advances in technology are sophisticated, we cannot ensure that it will always work on either your end or our end.  If the connection with a video visit is poor, the visit may have to be switched to a telephone visit.  With either a video or telephone visit, we are not always able to ensure that we have a secure connection.     I need to obtain your verbal consent now.   Are you willing to proceed with your visit today?    Lacey Melendez has provided verbal consent on 09/26/2021 for a virtual visit (video or telephone).   Margaretann Loveless, PA-C   Date: 09/26/2021 2:42 PM   Virtual Visit via Video Note   I, Margaretann Loveless, connected with  Lacey Melendez  ([id number], 08-23-1996) on 09/26/21 at  2:30 PM EST by a video-enabled telemedicine application and verified that I am speaking with the correct person using two identifiers.  Location: Patient: Virtual Visit Location Patient: Home Provider: Virtual Visit Location Provider: Home Office   I discussed the limitations of evaluation and management  by telemedicine and the availability of in person appointments. The patient expressed understanding and agreed to proceed.    History of Present Illness: Lacey Melendez is a 26 y.o. who identifies as a female who was assigned female at birth, and is being seen today for URI symptoms.  HPI: URI  This is a new problem. The current episode started in the past 7 days. The problem has been unchanged. Associated symptoms include congestion, coughing and sinus pain (mild, not bad). Associated symptoms comments: Body aches. She has tried acetaminophen (theraflu, warm baths) for the symptoms. The treatment provided mild relief.     Problems:  Patient Active Problem List   Diagnosis Date Noted   Carpal tunnel syndrome during pregnancy 08/30/2021   Supervision of normal first pregnancy, antepartum 08/02/2021   Drug use affecting pregnancy 08/02/2021   Encounter for supervision of normal first pregnancy in first trimester 08/02/2021   History of drug overdose 03/25/2021    Allergies: No Known Allergies Medications:  Current Outpatient Medications:    Blood Pressure Monitor MISC, Please  check blood pressure 1-2 times per week, Disp: 1 each, Rfl: 0   prenatal vitamin w/FE, FA (PRENATAL 1 + 1) 27-1 MG TABS tablet, Take 1 tablet by mouth daily at 12 noon., Disp: , Rfl:   Observations/Objective: Patient is well-developed, well-nourished in no acute distress.  Resting comfortably  at home.  Head is normocephalic, atraumatic.  No labored breathing.  Speech is clear and coherent with logical content.  Patient is alert and oriented at baseline.    Assessment and Plan: 1. Viral URI  - Suspect Viral URI - Discussed safe medications and alternative treatments  - List sent via AVS - Seek further evaluation if symptoms worsen or fail to improve (Has appt with OB tomorrow)  Follow Up Instructions: I discussed the assessment and treatment plan with the patient. The patient was provided an opportunity  to ask questions and all were answered. The patient agreed with the plan and demonstrated an understanding of the instructions.  A copy of instructions were sent to the patient via MyChart unless otherwise noted below.   The patient was advised to call back or seek an in-person evaluation if the symptoms worsen or if the condition fails to improve as anticipated.  Time:  I spent 10 minutes with the patient via telehealth technology discussing the above problems/concerns.    Margaretann Loveless, PA-C

## 2021-09-27 ENCOUNTER — Encounter: Payer: Medicaid Other | Admitting: Nurse Practitioner

## 2021-09-27 ENCOUNTER — Other Ambulatory Visit: Payer: Self-pay

## 2021-09-27 ENCOUNTER — Ambulatory Visit (INDEPENDENT_AMBULATORY_CARE_PROVIDER_SITE_OTHER): Payer: Medicaid Other | Admitting: Licensed Clinical Social Worker

## 2021-09-27 DIAGNOSIS — F439 Reaction to severe stress, unspecified: Secondary | ICD-10-CM

## 2021-09-30 NOTE — BH Specialist Note (Signed)
Integrated Behavioral Health via Telemedicine Visit  09/30/2021 Lacey Melendez [id number]  Number of Integrated Behavioral Health visits: 1 Session Start time: 2:00pm  Session End time: 2:27pm Total time: 27 mins via mychart video   Referring Provider: Lilyan Punt NP Patient/Family location: Home  [facility] Provider location: [facility]  All persons participating in visit: [name] and LCSW A. Aneli Zara  Types of Service: Individual psychotherapy and Video visit  I connected with Lacey Melendez and/or Lacey Melendez's n/a via  Telephone or Video Enabled Telemedicine Application  (Video is Caregility application) and verified that I am speaking with the correct person using two identifiers. Discussed confidentiality: Yes   I discussed the limitations of telemedicine and the availability of in person appointments.  Discussed there is a possibility of technology failure and discussed alternative modes of communication if that failure occurs.  I discussed that engaging in this telemedicine visit, they consent to the provision of behavioral healthcare and the services will be billed under their insurance.  Patient and/or legal guardian expressed understanding and consented to Telemedicine visit: Yes   Presenting Concerns: Patient and/or family reports the following symptoms/concerns: situational stress  Duration of problem: approx 3 months ; Severity of problem: mild  Patient and/or Family's Strengths/Protective Factors: Concrete supports in place (healthy food, safe environments, etc.)  Goals Addressed: Patient will:  Reduce symptoms of: stress   Increase knowledge and/or ability of: coping skills   Demonstrate ability to: Increase healthy adjustment to current life circumstances  Progress towards Goals: Ongoing  Interventions: Interventions utilized:  Supportive Counseling Standardized Assessments completed:   Assessment: Patient currently experiencing situational stress .    Patient may benefit from integrated behavioral health .  Plan: Follow up with behavioral health clinician on : 2 weeks via mychart  Behavioral recommendations: prioritize rest, keep medical appts, collaborate with community resources for added support, create boundaries and intentionally schedule self care activities to prevent burnout.  Referral(s): Integrated [facility] (In Clinic)  I discussed the assessment and treatment plan with the patient and/or parent/guardian. They were provided an opportunity to ask questions and all were answered. They agreed with the plan and demonstrated an understanding of the instructions.   They were advised to call back or seek an in-person evaluation if the symptoms worsen or if the condition fails to improve as anticipated.  Gwyndolyn Saxon, LCSW

## 2021-10-12 ENCOUNTER — Ambulatory Visit (INDEPENDENT_AMBULATORY_CARE_PROVIDER_SITE_OTHER): Payer: Medicaid Other | Admitting: Obstetrics and Gynecology

## 2021-10-12 ENCOUNTER — Other Ambulatory Visit: Payer: Self-pay

## 2021-10-12 VITALS — BP 101/63 | HR 92 | Wt 151.0 lb

## 2021-10-12 DIAGNOSIS — Z3400 Encounter for supervision of normal first pregnancy, unspecified trimester: Secondary | ICD-10-CM

## 2021-10-12 DIAGNOSIS — O99322 Drug use complicating pregnancy, second trimester: Secondary | ICD-10-CM

## 2021-10-12 DIAGNOSIS — Z3401 Encounter for supervision of normal first pregnancy, first trimester: Secondary | ICD-10-CM

## 2021-10-12 DIAGNOSIS — Z9189 Other specified personal risk factors, not elsewhere classified: Secondary | ICD-10-CM

## 2021-10-12 DIAGNOSIS — G5600 Carpal tunnel syndrome, unspecified upper limb: Secondary | ICD-10-CM

## 2021-10-12 DIAGNOSIS — O26899 Other specified pregnancy related conditions, unspecified trimester: Secondary | ICD-10-CM

## 2021-10-12 NOTE — Progress Notes (Signed)
Patient presents for ROB. Patient thought that she was scheduled for her GTT today. Patient will need to be scheduled for this at next visit. No other concerns.

## 2021-10-12 NOTE — Progress Notes (Signed)
° °  PRENATAL VISIT NOTE  Subjective:  Lacey Melendez is a 26 y.o. G1P0 at [redacted]w[redacted]d being seen today for ongoing prenatal care.  She is currently monitored for the following issues for this low-risk pregnancy and has Drug use affecting pregnancy; Encounter for supervision of normal first pregnancy in first trimester; History of drug overdose; and Carpal tunnel syndrome during pregnancy on their problem list.  Patient reports no complaints except carpal tunnel. Has numbness all the time. She is a Child psychotherapist. Using wrist splints which helps some.  Contractions: Not present. Vag. Bleeding: None.  Movement: Present. Denies leaking of fluid.   The following portions of the patient's history were reviewed and updated as appropriate: allergies, current medications, past family history, past medical history, past social history, past surgical history and problem list.   Objective:   Vitals:   10/12/21 1433  BP: 101/63  Pulse: 92  Weight: 151 lb (68.5 kg)    Fetal Status: Fetal Heart Rate (bpm): 140 Fundal Height: 27 cm Movement: Present     General:  Alert, oriented and cooperative. Patient is in no acute distress.  Skin: Skin is warm and dry. No rash noted.   Cardiovascular: Normal heart rate noted  Respiratory: Normal respiratory effort, no problems with respiration noted  Abdomen: Soft, gravid, appropriate for gestational age.  Pain/Pressure: Absent     Pelvic: Cervical exam deferred        Extremities: Normal range of motion.  Edema: None  Mental Status: Normal mood and affect. Normal behavior. Normal judgment and thought content.   Assessment and Plan:  Pregnancy: G1P0 at [redacted]w[redacted]d 1. Carpal tunnel syndrome during pregnancy Would encourage wrist splints. If not helpful, would suggest referral to ortho.    2. Supervision of normal first pregnancy, antepartum Normal NIPS, Horizon wnl Anatomy wnl and f/u US also WNL with growth 59%ile on 12/29 28w labs today Offered and recommended tdap - pt  thinks she will get it but would like to consider and likely would get it next time.  She declined flu shot  3. Drug use affecting pregnancy in second trimester This is not a current issue. 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 She has no recent use nor use during pregnancy.   Preterm labor symptoms and general obstetric precautions including but not limited to vaginal bleeding, contractions, leaking of fluid and fetal movement were reviewed in detail with the patient. Please refer to After Visit Summary for other counseling recommendations.   Return in about 2 weeks (around 10/26/2021) for 2 hr GTT -sooner .  No future appointments.   Milas Hock, MD

## 2021-10-20 ENCOUNTER — Ambulatory Visit: Payer: Medicaid Other | Admitting: Orthopedic Surgery

## 2021-10-26 ENCOUNTER — Ambulatory Visit (INDEPENDENT_AMBULATORY_CARE_PROVIDER_SITE_OTHER): Payer: Medicaid Other | Admitting: Obstetrics and Gynecology

## 2021-10-26 ENCOUNTER — Other Ambulatory Visit: Payer: Medicaid Other

## 2021-10-26 ENCOUNTER — Encounter: Payer: Self-pay | Admitting: Obstetrics and Gynecology

## 2021-10-26 ENCOUNTER — Other Ambulatory Visit: Payer: Self-pay

## 2021-10-26 DIAGNOSIS — Z3401 Encounter for supervision of normal first pregnancy, first trimester: Secondary | ICD-10-CM

## 2021-10-26 NOTE — Progress Notes (Signed)
ROB/GTT.  Declined TDAP. 

## 2021-10-26 NOTE — Progress Notes (Signed)
Subjective:  Lacey Melendez is a 26 y.o. G1P0 at [redacted]w[redacted]d being seen today for ongoing prenatal care.  She is currently monitored for the following issues for this low-risk pregnancy and has Drug use affecting pregnancy; Encounter for supervision of normal first pregnancy in first trimester; History of drug overdose; and Carpal tunnel syndrome during pregnancy on their problem list.  Patient reports general discomforts of pregnancy.  Contractions: Not present. Vag. Bleeding: None.  Movement: Present. Denies leaking of fluid.   The following portions of the patient's history were reviewed and updated as appropriate: allergies, current medications, past family history, past medical history, past social history, past surgical history and problem list. Problem list updated.  Objective:   Vitals:   10/26/21 0914  BP: 110/72  Pulse: 88  Weight: 153 lb (69.4 kg)    Fetal Status: Fetal Heart Rate (bpm): 147   Movement: Present     General:  Alert, oriented and cooperative. Patient is in no acute distress.  Skin: Skin is warm and dry. No rash noted.   Cardiovascular: Normal heart rate noted  Respiratory: Normal respiratory effort, no problems with respiration noted  Abdomen: Soft, gravid, appropriate for gestational age. Pain/Pressure: Absent     Pelvic:  Cervical exam deferred        Extremities: Normal range of motion.  Edema: None  Mental Status: Normal mood and affect. Normal behavior. Normal judgment and thought content.   Urinalysis:      Assessment and Plan:  Pregnancy: G1P0 at [redacted]w[redacted]d  1. Encounter for supervision of normal first pregnancy in first trimester Stable Glucola and 28 week labs today  Preterm labor symptoms and general obstetric precautions including but not limited to vaginal bleeding, contractions, leaking of fluid and fetal movement were reviewed in detail with the patient. Please refer to After Visit Summary for other counseling recommendations.  Return in about 2  weeks (around 11/09/2021) for OB visit, face to face, any provider.   Hermina Staggers, MD

## 2021-10-26 NOTE — Patient Instructions (Signed)

## 2021-10-27 LAB — CBC
Hematocrit: 35.8 % (ref 34.0–46.6)
Hemoglobin: 12.3 g/dL (ref 11.1–15.9)
MCH: 29.9 pg (ref 26.6–33.0)
MCHC: 34.4 g/dL (ref 31.5–35.7)
MCV: 87 fL (ref 79–97)
Platelets: 198 10*3/uL (ref 150–450)
RBC: 4.11 x10E6/uL (ref 3.77–5.28)
RDW: 11.8 % (ref 11.7–15.4)
WBC: 8.9 10*3/uL (ref 3.4–10.8)

## 2021-10-27 LAB — GLUCOSE TOLERANCE, 2 HOURS W/ 1HR
Glucose, 1 hour: 124 mg/dL (ref 70–179)
Glucose, 2 hour: 63 mg/dL — ABNORMAL LOW (ref 70–152)
Glucose, Fasting: 77 mg/dL (ref 70–91)

## 2021-10-27 LAB — RPR: RPR Ser Ql: NONREACTIVE

## 2021-10-27 LAB — HIV ANTIBODY (ROUTINE TESTING W REFLEX): HIV Screen 4th Generation wRfx: NONREACTIVE

## 2021-10-28 ENCOUNTER — Encounter: Payer: Self-pay | Admitting: Orthopedic Surgery

## 2021-10-28 ENCOUNTER — Other Ambulatory Visit: Payer: Self-pay

## 2021-10-28 ENCOUNTER — Ambulatory Visit (INDEPENDENT_AMBULATORY_CARE_PROVIDER_SITE_OTHER): Payer: Medicaid Other | Admitting: Orthopedic Surgery

## 2021-10-28 DIAGNOSIS — M654 Radial styloid tenosynovitis [de Quervain]: Secondary | ICD-10-CM | POA: Diagnosis not present

## 2021-10-28 DIAGNOSIS — O26899 Other specified pregnancy related conditions, unspecified trimester: Secondary | ICD-10-CM

## 2021-10-28 DIAGNOSIS — G5600 Carpal tunnel syndrome, unspecified upper limb: Secondary | ICD-10-CM | POA: Diagnosis not present

## 2021-10-28 HISTORY — DX: Radial styloid tenosynovitis (de quervain): M65.4

## 2021-10-28 MED ORDER — BETAMETHASONE SOD PHOS & ACET 6 (3-3) MG/ML IJ SUSP
6.0000 mg | INTRAMUSCULAR | Status: AC | PRN
  Administered 2021-10-28: 6 mg via INTRA_ARTICULAR

## 2021-10-28 MED ORDER — LIDOCAINE HCL 1 % IJ SOLN
1.0000 mL | INTRAMUSCULAR | Status: AC | PRN
  Administered 2021-10-28: 1 mL

## 2021-10-28 NOTE — Progress Notes (Signed)
Office Visit Note   Patient: Lacey Melendez           Date of Birth: 1996-01-31           MRN: [id number] Visit Date: 10/28/2021              Requested by: Milas Hock, MD [address] [place],  [place] [address] PCP: Patient, No Pcp Per (Inactive)   Assessment & Plan: Visit Diagnoses:  1. Carpal tunnel syndrome during pregnancy   2. De Quervain's tenosynovitis, right     Plan: We discussed the diagnosis, prognosis, non-operative and operative treatment options for both carpal tunnel and de Quervain's tenosynovitis associated with pregnancy. .  After our discussion, the patient would like to proceed with corticosteroid injection into the right first dorsal compartment and night splinting of the left wrist.  We discussed that her carpal tunnel is likely to resolve following delivery. We reviewed the risks and benefits of conservative management.  The patient expressed understanding of the reasoning and strategy going forward.  All patient questions and concerns were addressed.    Follow-Up Instructions: No follow-ups on file.   Orders:  No orders of the defined types were placed in this encounter.  No orders of the defined types were placed in this encounter.     Procedures: Hand/UE Inj: R extensor compartment 1 for de Quervain's tenosynovitis on 10/28/2021 3:20 PM Indications: tendon swelling, therapeutic and pain Details: 25 G needle, radial approach Medications: 1 mL lidocaine 1 %; 6 mg betamethasone acetate-betamethasone sodium phosphate 6 (3-3) MG/ML Outcome: tolerated well, no immediate complications Procedure, treatment alternatives, risks and benefits explained, specific risks discussed. Consent was given by the patient. Patient was prepped and draped in the usual sterile fashion.      Clinical Data: No additional findings.   Subjective: Chief Complaint  Patient presents with   Left Hand - Numbness   Right Hand - Numbness    This is a 26 year old  right-hand-dominant female who is [redacted] weeks pregnant presents with right radial styloid pain and left hand numbness and tingling.  This is been going on for about a month or so ago.  She describes pain on the right side directly over the radial styloid that is worse with lifting or rotational activities.  It is quite bothersome.  On the left she describes numbness and tingling in the thumb, index, middle fingers.  She has nocturnal symptoms 7 nights per week in which she wakes up and shakes her fingers for relief.  She is worn a type of brace intermittently but the particular brace was quite uncomfortable.  She still has not had any treatment.   Review of Systems   Objective: Vital Signs: LMP 03/22/2021   Physical Exam Constitutional:      Appearance: Normal appearance.  Cardiovascular:     Rate and Rhythm: Normal rate.     Pulses: Normal pulses.  Pulmonary:     Effort: Pulmonary effort is normal.  Skin:    General: Skin is warm and dry.     Capillary Refill: Capillary refill takes less than 2 seconds.  Neurological:     Mental Status: She is alert.    Right Hand Exam   Tenderness  Right hand tenderness location: TTP just proximal to the radial styloid w/ mild to moderate swelling.  Range of Motion  The patient has normal right wrist ROM.   Other  Erythema: absent Sensation: normal Pulse: present  Comments:  ++ Finkelstein test.  Left Hand Exam   Tenderness  The patient is experiencing no tenderness.   Range of Motion  The patient has normal left wrist ROM.  Muscle Strength  The patient has normal left wrist strength.  Other  Erythema: absent Sensation: normal Pulse: present  Comments:  + Phalen and Tinel signs.  5/5 thenar motor strength with no atrophy.      Specialty Comments:  No specialty comments available.  Imaging: No results found.   PMFS History: Patient Active Problem List   Diagnosis Date Noted   De Quervain's tenosynovitis, right  10/28/2021   Carpal tunnel syndrome during pregnancy 08/30/2021   Drug use affecting pregnancy 08/02/2021   Encounter for supervision of normal first pregnancy in first trimester 08/02/2021   History of drug overdose 03/25/2021   Past Medical History:  Diagnosis Date   De Quervain's tenosynovitis, right 10/28/2021   Vaginal Pap smear, abnormal 03/2020   high hpv    Family History  Problem Relation Age of Onset   Hyperlipidemia Maternal Grandmother    Diabetes Paternal Grandmother     History reviewed. No pertinent surgical history. Social History   Occupational History   Not on file  Tobacco Use   Smoking status: Every Day   Smokeless tobacco: Never  Vaping Use   Vaping Use: Some days   Substances: Nicotine, THC, CBD, Flavoring, Nicotine-salt, Synthetic cannabinoids  Substance and Sexual Activity   Alcohol use: Not Currently    Comment: social   Drug use: Yes    Types: Marijuana    Comment: last use 12 .27.2022   Sexual activity: Not on file

## 2021-11-09 ENCOUNTER — Ambulatory Visit (INDEPENDENT_AMBULATORY_CARE_PROVIDER_SITE_OTHER): Payer: Medicaid Other | Admitting: Obstetrics and Gynecology

## 2021-11-09 ENCOUNTER — Other Ambulatory Visit: Payer: Self-pay

## 2021-11-09 ENCOUNTER — Encounter: Payer: Self-pay | Admitting: Obstetrics and Gynecology

## 2021-11-09 VITALS — BP 103/67 | HR 103 | Wt 154.3 lb

## 2021-11-09 DIAGNOSIS — Z3401 Encounter for supervision of normal first pregnancy, first trimester: Secondary | ICD-10-CM

## 2021-11-09 NOTE — Progress Notes (Signed)
Subjective:  Lacey Melendez is a 26 y.o. G1P0 at [redacted]w[redacted]d being seen today for ongoing prenatal care.  She is currently monitored for the following issues for this low-risk pregnancy and has Drug use affecting pregnancy; Encounter for supervision of normal first pregnancy in first trimester; History of drug overdose; Carpal tunnel syndrome during pregnancy; and De Quervain's tenosynovitis, right on their problem list.  Patient reports no complaints.  Contractions: Not present. Vag. Bleeding: None.  Movement: Present. Denies leaking of fluid.   The following portions of the patient's history were reviewed and updated as appropriate: allergies, current medications, past family history, past medical history, past social history, past surgical history and problem list. Problem list updated.  Objective:   Vitals:   11/09/21 1439  BP: 103/67  Pulse: (!) 103  Weight: 154 lb 4.8 oz (70 kg)    Fetal Status: Fetal Heart Rate (bpm): 145   Movement: Present     General:  Alert, oriented and cooperative. Patient is in no acute distress.  Skin: Skin is warm and dry. No rash noted.   Cardiovascular: Normal heart rate noted  Respiratory: Normal respiratory effort, no problems with respiration noted  Abdomen: Soft, gravid, appropriate for gestational age. Pain/Pressure: Absent     Pelvic:  Cervical exam deferred        Extremities: Normal range of motion.  Edema: Trace  Mental Status: Normal mood and affect. Normal behavior. Normal judgment and thought content.   Urinalysis:      Assessment and Plan:  Pregnancy: G1P0 at [redacted]w[redacted]d  1. Encounter for supervision of normal first pregnancy in first trimester Stable  Preterm labor symptoms and general obstetric precautions including but not limited to vaginal bleeding, contractions, leaking of fluid and fetal movement were reviewed in detail with the patient. Please refer to After Visit Summary for other counseling recommendations.  Return in about 2 weeks  (around 11/23/2021) for OB visit, face to face, any provider.   Hermina Staggers, MD

## 2021-11-09 NOTE — Patient Instructions (Signed)

## 2021-11-23 ENCOUNTER — Other Ambulatory Visit: Payer: Self-pay

## 2021-11-23 ENCOUNTER — Encounter: Payer: Self-pay | Admitting: Obstetrics and Gynecology

## 2021-11-23 ENCOUNTER — Ambulatory Visit (INDEPENDENT_AMBULATORY_CARE_PROVIDER_SITE_OTHER): Payer: Medicaid Other | Admitting: Obstetrics and Gynecology

## 2021-11-23 DIAGNOSIS — Z23 Encounter for immunization: Secondary | ICD-10-CM | POA: Diagnosis not present

## 2021-11-23 DIAGNOSIS — Z3401 Encounter for supervision of normal first pregnancy, first trimester: Secondary | ICD-10-CM

## 2021-11-23 NOTE — Patient Instructions (Signed)

## 2021-11-23 NOTE — Progress Notes (Signed)
Subjective:  ?Lacey Melendez is a 26 y.o. G1P0 at [redacted]w[redacted]d being seen today for ongoing prenatal care.  She is currently monitored for the following issues for this low-risk pregnancy and has Drug use affecting pregnancy; Encounter for supervision of normal first pregnancy in first trimester; History of drug overdose; Carpal tunnel syndrome during pregnancy; and De Quervain's tenosynovitis, right on their problem list. ? ?Patient reports general discomforts of pregnancy.  Contractions: Not present. Vag. Bleeding: None.  Movement: Present. Denies leaking of fluid.  ? ?The following portions of the patient's history were reviewed and updated as appropriate: allergies, current medications, past family history, past medical history, past social history, past surgical history and problem list. Problem list updated. ? ?Objective:  ? ?Vitals:  ? 11/23/21 1442  ?Weight: 153 lb (69.4 kg)  ? ? ?Fetal Status:     Movement: Present    ? ?General:  Alert, oriented and cooperative. Patient is in no acute distress.  ?Skin: Skin is warm and dry. No rash noted.   ?Cardiovascular: Normal heart rate noted  ?Respiratory: Normal respiratory effort, no problems with respiration noted  ?Abdomen: Soft, gravid, appropriate for gestational age. Pain/Pressure: Absent     ?Pelvic:  Cervical exam deferred        ?Extremities: Normal range of motion.  Edema: None  ?Mental Status: Normal mood and affect. Normal behavior. Normal judgment and thought content.  ? ?Urinalysis:     ? ?Assessment and Plan:  ?Pregnancy: G1P0 at [redacted]w[redacted]d ? ?1. Encounter for supervision of normal first pregnancy in first trimester ?Stable ?- Tdap vaccine greater than or equal to 7yo IM ? ?Preterm labor symptoms and general obstetric precautions including but not limited to vaginal bleeding, contractions, leaking of fluid and fetal movement were reviewed in detail with the patient. ?Please refer to After Visit Summary for other counseling recommendations.  ?Return in about 2  weeks (around 12/07/2021) for OB visit, face to face, any provider. ? ? ?Hermina Staggers, MD ?

## 2021-11-23 NOTE — Progress Notes (Signed)
ROB 33.[redacted] wks GA ?Reports baby does not move a lot, has felt that way for a while ?Wants TDAP today ?

## 2021-12-07 ENCOUNTER — Encounter: Payer: Self-pay | Admitting: Obstetrics

## 2021-12-07 ENCOUNTER — Other Ambulatory Visit: Payer: Self-pay

## 2021-12-07 ENCOUNTER — Ambulatory Visit (INDEPENDENT_AMBULATORY_CARE_PROVIDER_SITE_OTHER): Payer: Medicaid Other | Admitting: Obstetrics

## 2021-12-07 VITALS — BP 112/79 | HR 99 | Wt 159.0 lb

## 2021-12-07 DIAGNOSIS — Z3400 Encounter for supervision of normal first pregnancy, unspecified trimester: Secondary | ICD-10-CM

## 2021-12-07 NOTE — Progress Notes (Signed)
ROB 35.[redacted] wks GA ?No complaints other than carpal tunnel flare ? ?

## 2021-12-07 NOTE — Progress Notes (Signed)
Subjective:  ?Lacey Melendez is a 26 y.o. G1P0 at [redacted]w[redacted]d being seen today for ongoing prenatal care.  She is currently monitored for the following issues for this low-risk pregnancy and has Drug use affecting pregnancy; Encounter for supervision of normal first pregnancy in first trimester; History of drug overdose; Carpal tunnel syndrome during pregnancy; and De Quervain's tenosynovitis, right on their problem list. ? ?Patient reports no complaints.  Contractions: Not present. Vag. Bleeding: None.  Movement: Present. Denies leaking of fluid.  ? ?The following portions of the patient's history were reviewed and updated as appropriate: allergies, current medications, past family history, past medical history, past social history, past surgical history and problem list. Problem list updated. ? ?Objective:  ? ?Vitals:  ? 12/07/21 1501  ?BP: 112/79  ?Pulse: 99  ?Weight: 159 lb (72.1 kg)  ? ? ?Fetal Status: Fetal Heart Rate (bpm): 128   Movement: Present    ? ?General:  Alert, oriented and cooperative. Patient is in no acute distress.  ?Skin: Skin is warm and dry. No rash noted.   ?Cardiovascular: Normal heart rate noted  ?Respiratory: Normal respiratory effort, no problems with respiration noted  ?Abdomen: Soft, gravid, appropriate for gestational age. Pain/Pressure: Absent     ?Pelvic:  Cervical exam deferred        ?Extremities: Normal range of motion.  Edema: None  ?Mental Status: Normal mood and affect. Normal behavior. Normal judgment and thought content.  ? ?Urinalysis:     ? ?Assessment and Plan:  ?Pregnancy: G1P0 at [redacted]w[redacted]d ? ?1. Supervision of normal first pregnancy, antepartum ? ? ?Preterm labor symptoms and general obstetric precautions including but not limited to vaginal bleeding, contractions, leaking of fluid and fetal movement were reviewed in detail with the patient. ?Please refer to After Visit Summary for other counseling recommendations.  ? ?Return in about 1 week (around 12/14/2021) for  ROB. ? ? ?Brock Bad, MD  ?12/07/21  ?

## 2021-12-14 ENCOUNTER — Ambulatory Visit (INDEPENDENT_AMBULATORY_CARE_PROVIDER_SITE_OTHER): Payer: Medicaid Other | Admitting: Obstetrics and Gynecology

## 2021-12-14 ENCOUNTER — Other Ambulatory Visit: Payer: Self-pay

## 2021-12-14 DIAGNOSIS — Z3401 Encounter for supervision of normal first pregnancy, first trimester: Secondary | ICD-10-CM

## 2021-12-14 NOTE — Progress Notes (Signed)
ROB/GBS. Reports no problems today. 

## 2021-12-14 NOTE — Progress Notes (Signed)
? ?  PRENATAL VISIT NOTE ? ?Subjective:  ?Lacey Melendez is a 26 y.o. G1P0 at [redacted]w[redacted]d being seen today for ongoing prenatal care.  She is currently monitored for the following issues for this low-risk pregnancy and has Drug use affecting pregnancy; Encounter for supervision of normal first pregnancy in first trimester; History of drug overdose; Carpal tunnel syndrome during pregnancy; and De Quervain's tenosynovitis, right on their problem list. ? ?Patient reports no complaints.  Contractions: Not present. Vag. Bleeding: None.  Movement: Present. Denies leaking of fluid.  ? ?The following portions of the patient's history were reviewed and updated as appropriate: allergies, current medications, past family history, past medical history, past social history, past surgical history and problem list.  ? ?Objective:  ? ?Vitals:  ? 12/14/21 1440  ?BP: 113/73  ?Pulse: 98  ?Weight: 159 lb (72.1 kg)  ? ? ?Fetal Status: Fetal Heart Rate (bpm): 134 Fundal Height: 37 cm Movement: Present    ? ?General:  Alert, oriented and cooperative. Patient is in no acute distress.  ?Skin: Skin is warm and dry. No rash noted.   ?Cardiovascular: Normal heart rate noted  ?Respiratory: Normal respiratory effort, no problems with respiration noted  ?Abdomen: Soft, gravid, appropriate for gestational age.  Pain/Pressure: Absent     ?Pelvic: Cervical exam deferred        ?Extremities: Normal range of motion.  Edema: None  ?Mental Status: Normal mood and affect. Normal behavior. Normal judgment and thought content.  ? ?Assessment and Plan:  ?Pregnancy: G1P0 at [redacted]w[redacted]d ?1. Encounter for supervision of normal first pregnancy in first trimester ? ?Patient's appointment at 3:30, patient showed up at 2:30. Given the business of the office I was not able to see her until her scheduled time at 3:30. Patient was dressed and ready to leave. She requested vaginal swabs at next visit.  ? ?Term labor symptoms and general obstetric precautions including but not  limited to vaginal bleeding, contractions, leaking of fluid and fetal movement were reviewed in detail with the patient. ?Please refer to After Visit Summary for other counseling recommendations.  ? ?Return in about 1 week (around 12/21/2021), or For GBS and GC- please schedule ealry in the week.. ? ?Future Appointments  ?Date Time Provider [place]  ?12/21/2021  2:30 PM Griffin Basil, MD [facility] None  ? ? ?Noni Saupe, NP  ?

## 2021-12-21 ENCOUNTER — Ambulatory Visit (INDEPENDENT_AMBULATORY_CARE_PROVIDER_SITE_OTHER): Payer: Medicaid Other | Admitting: Obstetrics and Gynecology

## 2021-12-21 ENCOUNTER — Other Ambulatory Visit (HOSPITAL_COMMUNITY)
Admission: RE | Admit: 2021-12-21 | Discharge: 2021-12-21 | Disposition: A | Discharge status: Home / Self Care | Payer: Medicaid Other | Source: Ambulatory Visit | Attending: Obstetrics and Gynecology | Admitting: Obstetrics and Gynecology

## 2021-12-21 VITALS — BP 115/73 | HR 95 | Wt 161.0 lb

## 2021-12-21 DIAGNOSIS — Z3403 Encounter for supervision of normal first pregnancy, third trimester: Secondary | ICD-10-CM | POA: Diagnosis not present

## 2021-12-21 DIAGNOSIS — Z3A37 37 weeks gestation of pregnancy: Secondary | ICD-10-CM

## 2021-12-21 NOTE — Progress Notes (Signed)
? ?  PRENATAL VISIT NOTE ? ?Subjective:  ?Lacey Melendez is a 26 y.o. G1P0 at [redacted]w[redacted]d being seen today for ongoing prenatal care.  She is currently monitored for the following issues for this low-risk pregnancy and has Drug use affecting pregnancy; Encounter for supervision of normal first pregnancy in third trimester; History of drug overdose; Carpal tunnel syndrome during pregnancy; and De Quervain's tenosynovitis, right on their problem list. ? ?Patient doing well with no acute concerns today. She reports no complaints.  Contractions: Not present. Vag. Bleeding: None.  Movement: Present. Denies leaking of fluid.  ? ?The following portions of the patient's history were reviewed and updated as appropriate: allergies, current medications, past family history, past medical history, past social history, past surgical history and problem list. Problem list updated. ? ?Objective:  ? ?Vitals:  ? 12/21/21 1440  ?BP: 115/73  ?Pulse: 95  ?Weight: 161 lb (73 kg)  ? ? ?Fetal Status: Fetal Heart Rate (bpm): 140 Fundal Height: 38 cm Movement: Present    ? ?General:  Alert, oriented and cooperative. Patient is in no acute distress.  ?Skin: Skin is warm and dry. No rash noted.   ?Cardiovascular: Normal heart rate noted  ?Respiratory: Normal respiratory effort, no problems with respiration noted  ?Abdomen: Soft, gravid, appropriate for gestational age.  Pain/Pressure: Absent     ?Pelvic: Cervical exam deferred        ?Extremities: Normal range of motion.     ?Mental Status:  Normal mood and affect. Normal behavior. Normal judgment and thought content.  ? ?Assessment and Plan:  ?Pregnancy: G1P0 at [redacted]w[redacted]d ? ?1. [redacted] weeks gestation of pregnancy ? ? ?2. Encounter for supervision of normal first pregnancy in third trimester ?Vaginal swabs taken, discussed labor symptoms ? ?- Cervicovaginal ancillary only( [place]) ?- Culture, beta strep (group b only) ? ?Term labor symptoms and general obstetric precautions including but not limited  to vaginal bleeding, contractions, leaking of fluid and fetal movement were reviewed in detail with the patient. ? ?Please refer to After Visit Summary for other counseling recommendations.  ? ?Return in about 1 week (around 12/28/2021) for ROB, in person. ? ? ?Mariel Aloe, MD ?Faculty Attending ?Center for [facility] ?  ?

## 2021-12-22 LAB — CERVICOVAGINAL ANCILLARY ONLY
Chlamydia: NEGATIVE
Comment: NEGATIVE
Comment: NORMAL
Neisseria Gonorrhea: NEGATIVE

## 2021-12-25 LAB — CULTURE, BETA STREP (GROUP B ONLY): Strep Gp B Culture: NEGATIVE

## 2021-12-26 ENCOUNTER — Encounter: Payer: Self-pay | Admitting: Obstetrics

## 2021-12-29 ENCOUNTER — Encounter: Payer: Self-pay | Admitting: Obstetrics

## 2021-12-29 ENCOUNTER — Ambulatory Visit (INDEPENDENT_AMBULATORY_CARE_PROVIDER_SITE_OTHER): Payer: Medicaid Other | Admitting: Obstetrics

## 2021-12-29 VITALS — BP 117/73 | HR 84 | Wt 165.5 lb

## 2021-12-29 DIAGNOSIS — G5600 Carpal tunnel syndrome, unspecified upper limb: Secondary | ICD-10-CM

## 2021-12-29 DIAGNOSIS — Z3403 Encounter for supervision of normal first pregnancy, third trimester: Secondary | ICD-10-CM

## 2021-12-29 DIAGNOSIS — O99322 Drug use complicating pregnancy, second trimester: Secondary | ICD-10-CM

## 2021-12-29 DIAGNOSIS — O26899 Other specified pregnancy related conditions, unspecified trimester: Secondary | ICD-10-CM

## 2021-12-29 DIAGNOSIS — F439 Reaction to severe stress, unspecified: Secondary | ICD-10-CM

## 2021-12-29 NOTE — Progress Notes (Signed)
Patient presents for ROB. Patient states that she fell on her butt a couple of days ago. She states that she had some pain in her left groin after the fall, but has mostly resolved. Denies having any LOF or bleeding after the fall. No other concerns at this time. ?

## 2021-12-29 NOTE — Progress Notes (Addendum)
Subjective:  ?Lacey Melendez is a 26 y.o. G1P0 at [redacted]w[redacted]d being seen today for ongoing prenatal care.  She is currently monitored for the following issues for this low-risk pregnancy and has Drug use affecting pregnancy; Encounter for supervision of normal first pregnancy in third trimester; History of drug overdose; Carpal tunnel syndrome during pregnancy; and De Quervain's tenosynovitis, right on their problem list. ? ?Patient reports no complaints.  Contractions: Irritability. Vag. Bleeding: None.  Movement: Present. Denies leaking of fluid.  ? ?The following portions of the patient's history were reviewed and updated as appropriate: allergies, current medications, past family history, past medical history, past social history, past surgical history and problem list. Problem list updated. ? ?Objective:  ? ?Vitals:  ? 12/29/21 1501  ?BP: 117/73  ?Pulse: 84  ?Weight: 165 lb 8 oz (75.1 kg)  ? ? ?Fetal Status: Fetal Heart Rate (bpm): 130   Movement: Present    ? ?General:  Alert, oriented and cooperative. Patient is in no acute distress.  ?Skin: Skin is warm and dry. No rash noted.   ?Cardiovascular: Normal heart rate noted  ?Respiratory: Normal respiratory effort, no problems with respiration noted  ?Abdomen: Soft, gravid, appropriate for gestational age. Pain/Pressure: Present     ?Pelvic:  Cervical exam performed      closed / 50% / -3 / Vtx  ?Extremities: Normal range of motion.  Edema: Trace  ?Mental Status: Normal mood and affect. Normal behavior. Normal judgment and thought content.  ? ?Urinalysis:     ? ?Assessment and Plan:  ?Pregnancy: G1P0 at [redacted]w[redacted]d ? ?1. Encounter for supervision of normal first pregnancy in third trimester ? ?2. Carpal tunnel syndrome during pregnancy ?- wearing wrist splint ? ?3. Drug use affecting pregnancy in second trimester ? ?4. Situational stress ? ? ?Term labor symptoms and general obstetric precautions including but not limited to vaginal bleeding, contractions, leaking of fluid  and fetal movement were reviewed in detail with the patient. ?Please refer to After Visit Summary for other counseling recommendations.  ? ?Return in about 1 week (around 01/05/2022) for ROB. ? ? ?Brock Bad, MD  ?12/29/21  ?

## 2021-12-29 NOTE — Addendum Note (Signed)
Addended by: Coral Ceo A on: 12/29/2021 04:25 PM ? ? Modules accepted: Orders ? ?

## 2021-12-30 ENCOUNTER — Encounter (HOSPITAL_COMMUNITY): Payer: Self-pay | Admitting: Obstetrics & Gynecology

## 2021-12-30 ENCOUNTER — Inpatient Hospital Stay (HOSPITAL_COMMUNITY)
Admission: AD | Admit: 2021-12-30 | Discharge: 2022-01-01 | DRG: 807 | Disposition: A | Discharge status: Home / Self Care | Payer: Medicaid Other | Attending: Obstetrics & Gynecology | Admitting: Obstetrics & Gynecology

## 2021-12-30 ENCOUNTER — Other Ambulatory Visit: Payer: Self-pay

## 2021-12-30 DIAGNOSIS — O26893 Other specified pregnancy related conditions, third trimester: Secondary | ICD-10-CM | POA: Diagnosis present

## 2021-12-30 DIAGNOSIS — Z30017 Encounter for initial prescription of implantable subdermal contraceptive: Secondary | ICD-10-CM | POA: Diagnosis not present

## 2021-12-30 DIAGNOSIS — Z3403 Encounter for supervision of normal first pregnancy, third trimester: Secondary | ICD-10-CM

## 2021-12-30 DIAGNOSIS — Z9189 Other specified personal risk factors, not elsewhere classified: Secondary | ICD-10-CM

## 2021-12-30 DIAGNOSIS — O99334 Smoking (tobacco) complicating childbirth: Secondary | ICD-10-CM | POA: Diagnosis present

## 2021-12-30 DIAGNOSIS — Z3A38 38 weeks gestation of pregnancy: Secondary | ICD-10-CM

## 2021-12-30 LAB — CBC
HCT: 43.7 % (ref 36.0–46.0)
Hemoglobin: 14.9 g/dL (ref 12.0–15.0)
MCH: 30.2 pg (ref 26.0–34.0)
MCHC: 34.1 g/dL (ref 30.0–36.0)
MCV: 88.6 fL (ref 80.0–100.0)
Platelets: 248 10*3/uL (ref 150–400)
RBC: 4.93 MIL/uL (ref 3.87–5.11)
RDW: 13.2 % (ref 11.5–15.5)
WBC: 11.2 10*3/uL — ABNORMAL HIGH (ref 4.0–10.5)
nRBC: 0 % (ref 0.0–0.2)

## 2021-12-30 LAB — TYPE AND SCREEN
ABO/RH(D): B POS
Antibody Screen: NEGATIVE

## 2021-12-30 MED ORDER — BENZOCAINE-MENTHOL 20-0.5 % EX AERO
1.0000 "application " | INHALATION_SPRAY | CUTANEOUS | Status: DC | PRN
  Administered 2021-12-30: 1 via TOPICAL
  Filled 2021-12-30: qty 56

## 2021-12-30 MED ORDER — SENNOSIDES-DOCUSATE SODIUM 8.6-50 MG PO TABS: 2.0000 | ORAL_TABLET | Freq: Every day | ORAL | Status: DC

## 2021-12-30 MED ORDER — OXYCODONE-ACETAMINOPHEN 5-325 MG PO TABS: 1.0000 | ORAL_TABLET | ORAL | Status: DC | PRN

## 2021-12-30 MED ORDER — TETANUS-DIPHTH-ACELL PERTUSSIS 5-2.5-18.5 LF-MCG/0.5 IM SUSY: 0.5000 mL | PREFILLED_SYRINGE | Freq: Once | INTRAMUSCULAR | Status: DC

## 2021-12-30 MED ORDER — OXYTOCIN-SODIUM CHLORIDE 30-0.9 UT/500ML-% IV SOLN
2.5000 [IU]/h | INTRAVENOUS | Status: DC
Start: 2021-12-30 — End: 2021-12-30
  Administered 2021-12-30: 2.5 [IU]/h via INTRAVENOUS
  Filled 2021-12-30: qty 500

## 2021-12-30 MED ORDER — COCONUT OIL OIL: 1.0000 "application " | TOPICAL_OIL | Status: DC | PRN

## 2021-12-30 MED ORDER — DIPHENHYDRAMINE HCL 25 MG PO CAPS: 25.0000 mg | ORAL_CAPSULE | Freq: Four times a day (QID) | ORAL | Status: DC | PRN

## 2021-12-30 MED ORDER — ONDANSETRON HCL 4 MG/2ML IJ SOLN: 4.0000 mg | Freq: Four times a day (QID) | INTRAMUSCULAR | Status: DC | PRN

## 2021-12-30 MED ORDER — DIBUCAINE (PERIANAL) 1 % EX OINT: 1.0000 "application " | TOPICAL_OINTMENT | CUTANEOUS | Status: DC | PRN

## 2021-12-30 MED ORDER — ACETAMINOPHEN 325 MG PO TABS: 650.0000 mg | ORAL_TABLET | ORAL | Status: DC | PRN

## 2021-12-30 MED ORDER — ONDANSETRON HCL 4 MG/2ML IJ SOLN: 4.0000 mg | INTRAMUSCULAR | Status: DC | PRN

## 2021-12-30 MED ORDER — SOD CITRATE-CITRIC ACID 500-334 MG/5ML PO SOLN: 30.0000 mL | ORAL | Status: DC | PRN

## 2021-12-30 MED ORDER — FENTANYL CITRATE (PF) 100 MCG/2ML IJ SOLN
100.0000 ug | INTRAMUSCULAR | Status: DC | PRN
  Administered 2021-12-30: 100 ug via INTRAVENOUS
  Filled 2021-12-30: qty 2

## 2021-12-30 MED ORDER — SIMETHICONE 80 MG PO CHEW: 80.0000 mg | CHEWABLE_TABLET | ORAL | Status: DC | PRN

## 2021-12-30 MED ORDER — IBUPROFEN 600 MG PO TABS
600.0000 mg | ORAL_TABLET | Freq: Four times a day (QID) | ORAL | Status: DC
  Administered 2021-12-30 – 2022-01-01 (×8): 600 mg via ORAL
  Filled 2021-12-30 (×8): qty 1

## 2021-12-30 MED ORDER — OXYTOCIN BOLUS FROM INFUSION
333.0000 mL | Freq: Once | INTRAVENOUS | Status: AC
  Administered 2021-12-30: 333 mL via INTRAVENOUS

## 2021-12-30 MED ORDER — PRENATAL MULTIVITAMIN CH
1.0000 | ORAL_TABLET | Freq: Every day | ORAL | Status: DC
  Administered 2021-12-31 – 2022-01-01 (×2): 1 via ORAL
  Filled 2021-12-30 (×2): qty 1

## 2021-12-30 MED ORDER — LACTATED RINGERS IV SOLN: INTRAVENOUS | Status: DC

## 2021-12-30 MED ORDER — ONDANSETRON HCL 4 MG PO TABS: 4.0000 mg | ORAL_TABLET | ORAL | Status: DC | PRN

## 2021-12-30 MED ORDER — OXYCODONE-ACETAMINOPHEN 5-325 MG PO TABS: 2.0000 | ORAL_TABLET | ORAL | Status: DC | PRN

## 2021-12-30 MED ORDER — WITCH HAZEL-GLYCERIN EX PADS: 1.0000 "application " | MEDICATED_PAD | CUTANEOUS | Status: DC | PRN

## 2021-12-30 MED ORDER — ACETAMINOPHEN 325 MG PO TABS
650.0000 mg | ORAL_TABLET | ORAL | Status: DC | PRN
  Administered 2021-12-30 – 2022-01-01 (×2): 650 mg via ORAL
  Filled 2021-12-30 (×2): qty 2

## 2021-12-30 MED ORDER — LIDOCAINE HCL (PF) 1 % IJ SOLN: 30.0000 mL | INTRAMUSCULAR | Status: DC | PRN

## 2021-12-30 MED ORDER — ZOLPIDEM TARTRATE 5 MG PO TABS
5.0000 mg | ORAL_TABLET | Freq: Every evening | ORAL | Status: DC | PRN
Start: 2021-12-30 — End: 2022-01-01

## 2021-12-30 MED ORDER — LACTATED RINGERS IV SOLN: 500.0000 mL | INTRAVENOUS | Status: DC | PRN

## 2021-12-30 NOTE — Discharge Summary (Addendum)
? ?  Postpartum Discharge Summary ? ? ? ?   ?Patient Name: Lacey Melendez ?DOB: 01/30/1996 ?MRN: [id number] ? ?Date of admission: 12/30/2021 ?Delivery date:12/30/2021  ?Delivering provider: SIMPSON, Titianna L  ?Date of discharge: 01/01/2022 ? ?Admitting diagnosis: Indication for care in labor or delivery [O75.9] ?Intrauterine pregnancy: [redacted]w[redacted]d     ?Secondary diagnosis:  Principal Problem: ?  SVD (spontaneous vaginal delivery) ?Active Problems: ?  Encounter for supervision of normal first pregnancy in third trimester ?  History of drug overdose ? ?Additional problems: None    ?Discharge diagnosis: Term Pregnancy Delivered                                              ?Post partum procedures: Nexplanon insertion postpartum (done 12/31/21 as per patient) ?Augmentation: Cytotec ?Complications: None ? ?Hospital course: Onset of Labor With Vaginal Delivery      ?26 y.o. yo G1P0 at [redacted]w[redacted]d was admitted in Active Labor on 12/30/2021. Patient had an uncomplicated labor course as follows:  ?Membrane Rupture Time/Date: 12:24 PM ,12/30/2021   ?Delivery Method:Vaginal, Spontaneous  ?Episiotomy: None  ?Lacerations:  1st degree  ?Patient had an uncomplicated postpartum course.  She is ambulating, tolerating a regular diet, passing flatus, and urinating well. Patient is discharged home in stable condition on 01/01/22. ? ?Newborn Data: ?Birth date:12/30/2021  ?Birth time:12:57 PM  ?Gender:Female  ?Living status:Living  ?Apgars:9 ,9  ?Weight:3170 g  ? ?Magnesium Sulfate received: No ?BMZ received: No ?Rhophylac:N/A ?MMR:N/A ?T-DaP:Given prenatally ?Flu: N/A ?Transfusion:No ? ?Physical exam  ?Vitals:  ? 12/31/21 0845 12/31/21 1406 12/31/21 2005 01/01/22 0500  ?BP: 116/74 117/70 116/72 110/75  ?Pulse: 74 70 72 80  ?Resp: 17  16 16  ?Temp: 98 ?F (36.7 ?C) 98.8 ?F (37.1 ?C) 98.6 ?F (37 ?C) 98.7 ?F (37.1 ?C)  ?TempSrc: Oral Axillary Oral Oral  ?SpO2:   99% 99%  ?Weight:      ?Height:      ? ?General: alert ?Lochia: appropriate ?Uterine Fundus:  firm ?Incision: N/A ?DVT Evaluation: No evidence of DVT seen on physical exam. ?Labs: ?Lab Results  ?Component Value Date  ? WBC 11.2 (H) 12/30/2021  ? HGB 14.9 12/30/2021  ? HCT 43.7 12/30/2021  ? MCV 88.6 12/30/2021  ? PLT 248 12/30/2021  ? ? ?  Latest Ref Rng & Units 03/29/2021  ? 10:29 PM  ?CMP  ?Glucose 70 - 99 mg/dL 86    ?BUN 6 - 20 mg/dL 6    ?Creatinine 0.44 - 1.00 mg/dL 0.77    ?Sodium 135 - 145 mmol/L 137    ?Potassium 3.5 - 5.1 mmol/L 4.0    ?Chloride 98 - 111 mmol/L 109    ?CO2 22 - 32 mmol/L 18    ?Calcium 8.9 - 10.3 mg/dL 8.9    ? ?Edinburgh Score: ? ?  12/31/2021  ?  8:32 PM  ?Edinburgh Postnatal Depression Scale Screening Tool  ?I have been able to laugh and see the funny side of things. 0  ?I have looked forward with enjoyment to things. 0  ?I have blamed myself unnecessarily when things went wrong. 1  ?I have been anxious or worried for no good reason. 2  ?I have felt scared or panicky for no good reason. 1  ?Things have been getting on top of me. 2  ?I have been so unhappy that I have had difficulty   sleeping. 0  ?I have felt sad or miserable. 0  ?I have been so unhappy that I have been crying. 1  ?The thought of harming myself has occurred to me. 0  ?Edinburgh Postnatal Depression Scale Total 7  ? ? ? ?After visit meds:  ?Allergies as of 01/01/2022   ?No Known Allergies ?  ? ?  ?Medication List  ?  ? ?TAKE these medications   ? ?acetaminophen 325 MG tablet ?Commonly known as: Tylenol ?Take 2 tablets (650 mg total) by mouth every 4 (four) hours as needed (for pain scale < 4). ?  ?Blood Pressure Monitor Misc ?Please  check blood pressure 1-2 times per week ?  ?ibuprofen 600 MG tablet ?Commonly known as: ADVIL ?Take 1 tablet (600 mg total) by mouth every 6 (six) hours. ?  ?prenatal vitamin w/FE, FA 27-1 MG Tabs tablet ?Take 1 tablet by mouth daily at 12 noon. ?  ? ?  ? ? ? ?Discharge home in stable condition ?Infant Feeding: Bottle and Breast ?Infant Disposition:home with mother ?Discharge instruction:  per After Visit Summary and Postpartum booklet. ?Activity: Advance as tolerated. Pelvic rest for 6 weeks.  ?Diet: routine diet ?Future Appointments: ?Future Appointments  ?Date Time Provider Department Center  ?01/04/2022  2:50 PM Harper, Charles A, MD [facility] None  ? ?Follow up Visit: ?Message sent to [facility] by Dr. Das on 4/8 ? ?Please schedule this patient for a In person postpartum visit in 6 weeks with the following provider: Any provider. ?Additional Postpartum F/U: n/a   ?Low risk pregnancy complicated by:  none ?Delivery mode:  Vaginal, Spontaneous  ?Anticipated Birth Control:  Nexplanon placed postpartum ? ? ?01/01/2022 ? , MD ? ? ? ?

## 2021-12-30 NOTE — MAU Note (Signed)
[facility] presentation confirmed by bedside U/S by Raelyn Mora, CNM. ?

## 2021-12-30 NOTE — MAU Note (Signed)
Lacey Melendez is a 26 y.o. at [redacted]w[redacted]d here in MAU reporting: ctxs since yesterday but increased intensity 7 frequency this morning @ 0400.  Endorses +FM.  Reports scant bloody discharge, unsure if LOF. ? ?Onset of complaint: today ?Pain score: 10 ?Vitals:  ? 12/30/21 1125  ?BP: 120/82  ?Pulse: 84  ?Resp: 20  ?Temp: 98.6 ?F (37 ?C)  ?SpO2: 99%  ?   ?FHT: 125 bpm ?Lab orders placed from triage:    ?

## 2021-12-30 NOTE — Lactation Note (Signed)
This note was copied from a baby's chart. ?Lactation Consultation Note ? ?Patient Name: Lacey Melendez ?Today's Date: 12/30/2021 ?Reason for consult: Initial assessment;Mother's request;Primapara;1st time breastfeeding;Early term 37-38.6wks;Breastfeeding assistance ?Age:28 hours ?LC assisted with latch, infant had a recent feeding and was asleep. ?LC reviewed feeding cues and how to ensure latch deep enough.  ?Mom nipples intact slight compression stripe on left breast.  ?Mom to call for latch assistance with next feeding. ?Plan 1.to feed based on cues 8-12x 24hr period. Mom to offer breasts and look for signs of milk transfer.  ?2. If unable to latch, Mom to hand express and offer colostrum on spoon then try a latch  ?3. I and O sheet reviewed.  ?All questions answered at the end of the visit.  ?Maternal Data ?Has patient been taught Hand Expression?: Yes ?Does the patient have breastfeeding experience prior to this delivery?: No ? ?Feeding ?Mother's Current Feeding Choice: Breast Milk ? ?LATCH Score ?  ? ?  ? ?  ? ?  ? ?  ? ?  ? ? ?Lactation Tools Discussed/Used ?  ? ?Interventions ?Interventions: Breast feeding basics reviewed;Assisted with latch;Skin to skin;Breast massage;Hand express;Position options;Expressed milk;Education;LC Psychologist, educational;Infant Driven Feeding Algorithm education ? ?Discharge ?WIC Program: Yes ? ?Consult Status ?Consult Status: Follow-up ?Date: 12/31/21 ?Follow-up type: In-patient ? ? ? ?Manuel Dall  Nicholson-Springer ?12/30/2021, 9:51 PM ? ? ? ?

## 2021-12-30 NOTE — H&P (Signed)
OBSTETRIC ADMISSION HISTORY AND PHYSICAL ? ?Lacey Melendez is a 26 y.o. female G1P0 with IUP at [redacted]w[redacted]d by early ultrasound presenting for active labor. She reports +FMs, No LOF, no VB, no blurry vision, headaches or peripheral edema, and RUQ pain.  She plans on breast and bottle feeding. She request Nexplanon for birth control. ?She received her prenatal care at  [facility]   ? ?Dating: By early [place] --->  Estimated Date of Delivery: 01/08/22 ? ?Sono:   ? ?@[redacted]w[redacted]d , CWD, normal anatomy, cephalic presentation, posterior placenta, 376g, 65% EFW ? ? ?Prenatal History/Complications:  ?- GBS negative ? ?Past Medical History: ?Past Medical History:  ?Diagnosis Date  ? De Quervain's tenosynovitis, right 10/28/2021  ? Vaginal Pap smear, abnormal 03/2020  ? high hpv  ? ? ?Past Surgical History: ?Past Surgical History:  ?Procedure Laterality Date  ? NO PAST SURGERIES    ? ? ?Obstetrical History: ?OB History   ? ? Gravida  ?1  ? Para  ?   ? Term  ?   ? Preterm  ?   ? AB  ?   ? Living  ?   ?  ? ? SAB  ?   ? IAB  ?   ? Ectopic  ?   ? Multiple  ?   ? Live Births  ?   ?   ?  ?  ? ? ?Social History ?Social History  ? ?Socioeconomic History  ? Marital status: Single  ?  Spouse name: Not on file  ? Number of children: Not on file  ? Years of education: Not on file  ? Highest education level: Not on file  ?Occupational History  ? Not on file  ?Tobacco Use  ? Smoking status: Every Day  ? Smokeless tobacco: Never  ?Vaping Use  ? Vaping Use: Some days  ? Substances: Nicotine, THC, CBD, Flavoring, Nicotine-salt, Synthetic cannabinoids  ?Substance and Sexual Activity  ? Alcohol use: Not Currently  ?  Comment: social  ? Drug use: Not Currently  ?  Types: Marijuana  ?  Comment: last use 12 .27.2022  ? Sexual activity: Not on file  ?Other Topics Concern  ? Not on file  ?Social History Narrative  ? Not on file  ? ?Social Determinants of Health  ? ?Financial Resource Strain: Not on file  ?Food Insecurity: Not on file  ?Transportation Needs: Not on  file  ?Physical Activity: Not on file  ?Stress: Not on file  ?Social Connections: Not on file  ? ? ?Family History: ?Family History  ?Problem Relation Age of Onset  ? Hyperlipidemia Maternal Grandmother   ? Diabetes Paternal Grandmother   ? ? ?Allergies: ?No Known Allergies ? ?Medications Prior to Admission  ?Medication Sig Dispense Refill Last Dose  ? prenatal vitamin w/FE, FA (PRENATAL 1 + 1) 27-1 MG TABS tablet Take 1 tablet by mouth daily at 12 noon.   12/30/2021  ? Blood Pressure Monitor MISC Please  check blood pressure 1-2 times per week 1 each 0   ? ?Review of Systems  ? ?All systems reviewed and negative except as stated in HPI ? ?Blood pressure 120/82, pulse 84, temperature 98.6 ?F (37 ?C), resp. rate 20, height 5\' 5"  (1.651 m), weight 164 lb 1.6 oz (74.4 kg), last menstrual period 03/22/2021, SpO2 99 %. ?General appearance: alert, cooperative, and moderate distress ?Lungs: effort normal ?Heart: regular rate ?Abdomen: soft, non-tender, gravid ?Extremities: no sign of DVT ?Presentation: cephalic confirmed by BSUS in MAU ?Fetal monitoring: 125  bpm, moderate variability, +15x15 accels, no decels ?Uterine activity: Q ?Dilation: 7.5 ?Effacement (%): 80 ?Station: -1 ?Exam by:: Camelia Eng ? ?Prenatal labs: ?ABO, Rh: --/--/B POS (04/07 1157) ?Antibody: NEG (04/07 1157) ?Rubella: 8.40 (11/08 1513) ?RPR: Non Reactive (02/01 0950)  ?HBsAg: Negative (11/08 1513)  ?HIV: Non Reactive (02/01 0950)  ?GBS: Negative/-- (03/29 1458)  ?2 hr Glucola: 77/124/63 ?Genetic screening: LR NIPS ?Anatomy US: normal ? ?Prenatal Transfer Tool  ?Maternal Diabetes: No ?Genetic Screening: Normal ?Maternal Ultrasounds/Referrals: Normal ?Fetal Ultrasounds or other Referrals:  None ?Maternal Substance Abuse:  No ?Significant Maternal Medications:  None ?Significant Maternal Lab Results: Group B Strep negative ? ?Results for orders placed or performed during the hospital encounter of 12/30/21 (from the past 24 hour(s))  ?CBC  ?  Collection Time: 12/30/21 11:57 AM  ?Result Value Ref Range  ? WBC 11.2 (H) 4.0 - 10.5 K/uL  ? RBC 4.93 3.87 - 5.11 MIL/uL  ? Hemoglobin 14.9 12.0 - 15.0 g/dL  ? HCT 43.7 36.0 - 46.0 %  ? MCV 88.6 80.0 - 100.0 fL  ? MCH 30.2 26.0 - 34.0 pg  ? MCHC 34.1 30.0 - 36.0 g/dL  ? RDW 13.2 11.5 - 15.5 %  ? Platelets 248 150 - 400 K/uL  ? nRBC 0.0 0.0 - 0.2 %  ?Type and screen [facility]  ? Collection Time: 12/30/21 11:57 AM  ?Result Value Ref Range  ? ABO/RH(D) B POS   ? Antibody Screen NEG   ? Sample Expiration    ?  01/02/2022,2359 ?Performed at [facility], [address] [place], [place] [address] ?  ? ? ?Patient Active Problem List  ? Diagnosis Date Noted  ? Indication for care in labor or delivery 12/30/2021  ? De Quervain's tenosynovitis, right 10/28/2021  ? Carpal tunnel syndrome during pregnancy 08/30/2021  ? Drug use affecting pregnancy 08/02/2021  ? Encounter for supervision of normal first pregnancy in third trimester 08/02/2021  ? History of drug overdose 03/25/2021  ? ? ?Assessment/Plan:  ?Lacey Melendez is a 26 y.o. G1P0 at [redacted]w[redacted]d here for active labor. ? ?#Labor: Expectant management ?#Pain: As desired ?#FWB: Cat 1 ?#ID: GBS neg ?#MOF: Breast/bottle ?#MOC:Nexplanon ?#Circ: N/A ? ? ?Brand Males, CNM  ?12/30/2021, 1:40 PM ? ? ? ?

## 2021-12-30 NOTE — Progress Notes (Signed)
Patient ID: Lacey Melendez, female   DOB: 1996-06-30, 26 y.o.   MRN: [id number] ? ?Lacey Melendez is a 26 y.o. G1P0 female at [redacted]w[redacted]d weeks gestation presenting to MAU with complaints of contractions. MAU provider was requested by RN to verify presentation. ? ?NST - FHR: 125 bpm / moderate variability / accels present / decels absent / TOCO: regular every 3 mins  ? ?Patient informed that the ultrasound is considered a limited OB ultrasound and is not intended to be a complete ultrasound exam.  Patient also informed that the ultrasound is not being completed with the intent of assessing for fetal or placental anomalies or any pelvic abnormalities.  Explained that the purpose of today?s ultrasound is to assess for presentation.  Baby was found to be in a cephalic presentation. Patient acknowledges the purpose of the exam and the limitations of the study. ? ?Raelyn Mora, CNM  ?12/30/2021 12:12 PM  ? ?

## 2021-12-31 DIAGNOSIS — Z30017 Encounter for initial prescription of implantable subdermal contraceptive: Secondary | ICD-10-CM

## 2021-12-31 LAB — RPR: RPR Ser Ql: NONREACTIVE

## 2021-12-31 MED ORDER — ETONOGESTREL 68 MG ~~LOC~~ IMPL
68.0000 mg | DRUG_IMPLANT | Freq: Once | SUBCUTANEOUS | Status: AC
  Administered 2021-12-31: 68 mg via SUBCUTANEOUS
  Filled 2021-12-31: qty 1

## 2021-12-31 MED ORDER — LIDOCAINE HCL 1 % IJ SOLN
0.0000 mL | Freq: Once | INTRAMUSCULAR | Status: AC | PRN
  Administered 2021-12-31: 20 mL via INTRADERMAL
  Filled 2021-12-31: qty 20

## 2021-12-31 MED ORDER — ACETAMINOPHEN 325 MG PO TABS: 650.0000 mg | ORAL_TABLET | ORAL | 0 refills | Status: AC | PRN

## 2021-12-31 MED ORDER — IBUPROFEN 600 MG PO TABS: 600.0000 mg | ORAL_TABLET | Freq: Four times a day (QID) | ORAL | 0 refills | Status: AC

## 2021-12-31 NOTE — Progress Notes (Signed)
Patient stated to Dr. Alvester Morin that she would like to go home tomorrow instead of today.  Dr. Alvester Morin ok with plan; RN discontinued d/c order.  ?

## 2021-12-31 NOTE — Lactation Note (Signed)
This note was copied from a baby's chart. ?Lactation Consultation Note ? ?Patient Name: Lacey Melendez ?Today's Date: 12/31/2021 ?Reason for consult: Follow-up assessment;Early term 37-38.6wks;Primapara;1st time breastfeeding ?Age:28 hours ? ? ?LC Note: ? ?RN requested latch assistance. ? ?RN had assisted baby "[name]" to latch in the football hold when I arrived.  Observed her to be latched deeply and rhythmically sucking.  Mother did have her sleeper on and I suggested feeding STS to help "[name]" stay awake and feed more effectively.  Mother verbalized understanding.  Observed her feeding for a total of 15 minutes before she self released.  Nipple rounded upon release; mother denied pain with feeding. ? ?Mother will call as needed for assistance.   ? ? ?Maternal Data ?  ? ?Feeding ?Mother's Current Feeding Choice: Breast Milk ? ?LATCH Score ?Latch: Grasps breast easily, tongue down, lips flanged, rhythmical sucking. ? ?Audible Swallowing: A few with stimulation ? ?Type of Nipple: Everted at rest and after stimulation ? ?Comfort (Breast/Nipple): Soft / non-tender ? ?Hold (Positioning): Assistance needed to correctly position infant at breast and maintain latch. ? ?LATCH Score: 8 ? ? ?Lactation Tools Discussed/Used ?  ? ?Interventions ?Interventions: Breast feeding basics reviewed;Assisted with latch;Breast massage;Breast compression;Support pillows;Education ? ?Discharge ?  ? ?Consult Status ?Consult Status: Follow-up ?Date: 01/01/22 ?Follow-up type: In-patient ? ? ? ?Jacody Beneke R Reighlyn Elmes ?12/31/2021, 6:42 PM ? ? ? ?

## 2021-12-31 NOTE — Clinical Social Work Maternal (Signed)
?CLINICAL SOCIAL WORK MATERNAL/CHILD NOTE ? ?Patient Details  ?Name: Lacey Melendez ?MRN: [id number] ?Date of Birth: 04/17/1996 ? ?Date:  12/31/2021 ? ?Clinical Social Worker Initiating Note:  Dawan Farney, LCSW Date/Time: Initiated:  12/31/21/1253    ? ?Child's Name:  [name] Kent  ? ?Biological Parents:  Mother, Father (Father: Byron Kent 07/10/93)  ? ?Need for Interpreter:  None  ? ?Reason for Referral:  Current Substance Use/Substance Use During Pregnancy    ? ?Address:  [address] ?[place] [address] (Dads Address) ?  ?Phone number:  [phone] (home)    ? ?Additional phone number:  ? ?Household Members/Support Persons (HM/SP):   Household Member/Support Person 1 ? ? ?HM/SP Name Relationship DOB or Age  ?HM/SP -1   brother    ?HM/SP -2        ?HM/SP -3        ?HM/SP -4        ?HM/SP -5        ?HM/SP -6        ?HM/SP -7        ?HM/SP -8        ? ? ?Natural Supports (not living in the home):  Immediate Family, Parent, Spouse/significant other  ? ?Professional Supports: None  ? ?Employment: Full-time  ? ?Type of Work: Manager/Server  ? ?Education:  High school graduate  ? ?Homebound arranged:   ? ?Financial Resources:  Medicaid  ? ?Other Resources:  WIC, Food Stamps    ? ?Cultural/Religious Considerations Which May Impact Care:   ? ?Strengths:  Ability to meet basic needs  , Home prepared for child    ? ?Psychotropic Medications:        ? ?Pediatrician:      ? ?Pediatrician List:  ? ?    ?[place]    ?[place]    ?[place]    ?[place]    ?[place]    ? ? ?Pediatrician Fax Number:   ? ?Risk Factors/Current Problems:  Substance Use  , Mental Health Concerns    ? ?Cognitive State:  Able to Concentrate  , Alert  , Goal Oriented  , Linear Thinking    ? ?Mood/Affect:  Calm  , Interested  , Comfortable    ? ?CSW Assessment: CSW met with MOB at bedside to complete psychosocial assessment. CSW introduced self and explained reason for consult. MOB was welcoming, pleasant,  and remained engaged during assessment. MOB reported that she resides with brother currently and shared that she recently got approved for a new place. MOB confirmed address on face sheet as her dad's address and reported that is the address she wants to use. 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 MOB reported that she receives both WIC and food stamps and has all items needed to care for infant including a car seat and basinet. CSW inquired about MOB's support system, MOB reported that FOB, her dad, mom, brother, sister, and step mom are supports.  ? ?FOB entered the room, CSW asked to speak with MOB privately. FOB left the room.  ? ?CSW inquired about MOB's mental health history. MOB reported that she has not been formally diagnosed with anxiety or depression but has been experiencing anxiety for more than 5 years and depression for the last 2 years. MOB reported that she is not currently taking any medication nor participating in therapy to treat anxiety/depression. MOB denied any current symptoms and reported that she does   not need any therapy resources. CSW inquired about how MOB was feeling emotionally since giving birth, MOB reported that she was feeling fine. MOB presented calm and did not demonstrate any acute mental health signs/symptoms. CSW assessed for safety, MOB denied SI, HI, and domestic violence.  ? ?CSW provided education regarding the baby blues period vs. perinatal mood disorders, discussed treatment and gave resources for mental health follow up if concerns arise.  CSW recommends self-evaluation during the postpartum time period using the New Mom Checklist from Postpartum Progress and encouraged MOB to contact a medical professional if symptoms are noted at any time.   ? ?CSW provided review of Sudden Infant Death Syndrome (SIDS) precautions.   ? ?CSW informed MOB about the hospital drug screen due to documented substance use during pregnancy. MOB  confirmed marijuana use and reported last use as 2-3 months ago. MOB reported that she used marijuana twice a month, not frequently. MOB denied any other substance use. CSW informed MOB that infant's UDS was positive for [facility] and a CPS report would be made. MOB verbalized understanding and denied any questions.  ? ?CSW made a [facility] CPS report due to infant's positive UDS for THC. CPS to follow up with family within 72 hours. ? ?CSW identifies no further need for intervention and no barriers to discharge at this time. ? ?CSW Plan/Description:  No Further Intervention Required/No Barriers to Discharge, Sudden Infant Death Syndrome (SIDS) Education, Perinatal Mood and Anxiety Disorder (PMADs) Education, [place], CSW Will Continue to Monitor Umbilical Cord Tissue Drug Screen Results and Make Report if Warranted, Child Protective Service Report    ? ? ?Burnis Medin, LCSW ?12/31/2021, 12:55 PM ? ?

## 2021-12-31 NOTE — Lactation Note (Signed)
This note was copied from a baby's chart. ?Lactation Consultation Note ?Baby hasn't fed in past 3 feedings that was due. Baby has no interest in BF at this time. Wouldn't even suckle on gloved finger, chewed and tongue thrusted finger out of mouth. Encouraged mom to cont. To try every 3 hrs or on cues.  ?Try hand expression and spoon feeding. LC hand expressed w/dots of colostrum glistening to tip of nipple.  ?After 24 hrs of age alert RN if baby still isn't interested in BF. ?Call for assistance as needed. ? ?Patient Name: Lacey Melendez ?Today's Date: 12/31/2021 ?Reason for consult: Mother's request;Primapara;Early term 37-38.6wks ?Age:28 hours ? ?Maternal Data ?  ? ?Feeding ?  ? ?LATCH Score ?Latch: Too sleepy or reluctant, no latch achieved, no sucking elicited. ? ?Audible Swallowing: None ? ?Type of Nipple: Everted at rest and after stimulation ? ?Comfort (Breast/Nipple): Soft / non-tender ? ?Hold (Positioning): Full assist, staff holds infant at breast ? ?LATCH Score: 4 ? ? ?Lactation Tools Discussed/Used ?  ? ?Interventions ?Interventions: Assisted with latch;Skin to skin;Hand express;Adjust position;Support pillows;Position options ? ?Discharge ?  ? ?Consult Status ?Consult Status: Follow-up ?Date: 12/31/21 ?Follow-up type: In-patient ? ? ? ?Theodoro Kalata ?12/31/2021, 5:03 AM ? ? ? ?

## 2022-01-01 NOTE — Procedures (Signed)
Lacey Melendez is a 26 y.o. G1P1001 scheduled for inpatient postpartum nexplanon insertion.    ? ?Location: Left arm ? ?Nexplanon Insertion Procedure ?Patient identified, informed consent performed, consent signed.   Patient does understand that irregular bleeding is a very common side effect of this medication. Appropriate time out taken.  Patient's left arm was prepped and draped in the usual sterile fashion.. The ruler used to measure and mark insertion area.  Patient was prepped with alcohol swab and then injected with 3 ml of 1% lidocaine.  She was prepped with betadine, Nexplanon removed from packaging,  Device confirmed in needle, then inserted full length of needle and withdrawn per handbook instructions. Nexplanon was able to palpated in the patient's arm; patient palpated the insert herself. There was minimal blood loss.  Patient insertion site covered with guaze and a pressure bandage to reduce any bruising.  The patient tolerated the procedure well and was given post procedure instructions.  ? ?Caren Macadam, MD ?Family Medicine ?[facility] ? ?

## 2022-01-01 NOTE — Progress Notes (Signed)
Post Partum Day 1 ?Subjective: ?No complaints, up ad lib, voiding, tolerating PO, and + flatus.  Baby stable ? ?Objective: ?Physical exam  ?      ?Vitals:  ?  12/30/21 2000 12/31/21 0018 12/31/21 0441 12/31/21 0845  ?BP: (!) 105/59 106/70 103/71 116/74  ?Pulse: 80 63 71 74  ?Resp: 18 18 18 17   ?Temp: 98.7 ?F (37.1 ?C) 98.2 ?F (36.8 ?C) 98.2 ?F (36.8 ?C) 98 ?F (36.7 ?C)  ?TempSrc: Oral Oral Oral Oral  ?SpO2: 100% 100% 100%    ?Weight:          ?Height:          ?  ?General: alert ?Lochia: appropriate ?Uterine Fundus: firm ?Incision: Healing well with no significant drainage ?DVT Evaluation: No evidence of DVT seen on physical exam. ? ?Recent Labs  ?  12/30/21 ?1157  ?HGB 14.9  ?HCT 43.7  ? ?Assessment/Plan: ?Plan for discharge tomorrow and Contraception Nexplanon ? ? LOS: 2 days  ? ?Verita Schneiders, MD ?01/01/2022, 9:13 AM  ? ? ?

## 2022-01-01 NOTE — Lactation Note (Signed)
This note was copied from a baby's chart. ?Lactation Consultation Note ? ?Patient Name: Lacey Melendez ?Today's Date: 01/01/2022 ?Reason for consult: Follow-up assessment;1st time breastfeeding;Primapara;Early term 37-38.6wks ?Age:28 hours ? ?LC in to visit with P1 Mom of ET infant on day of discharge.  Baby lost 4.6% and has good output and bilirubin levels WNL.  Mom's breasts are feeling heavier today.   ? ?Baby just received 40 ml of EBM by bottle an hour ago, as Mom double pumped.  Mom states she has a pump at home (unsure of brand, except its a good one).   ? ?Baby cueing.  Encouraged Mom to latch baby to the breast.  Watched as Mom positioned baby in football hold.  LC recommended unwrapping and providing STS to keep baby alert and more consistent on the breast.  Baby opened her mouth wide and Mom brought baby onto breast quickly with [facility] assistance.  No discomfort felt during feeding.  Mom taught alternate breast compression to increase milk transfer.  Mom with a lot of questions and seems a little unsure.  Offered to refer her to OP lactation and Mom eagerly agreed. ? ?Encouraged STS and watching baby for cues.  Mom to make sure baby is latched on deeply. ? ?Engorgement prevention and treatment reviewed. ?Mom aware of OP lactation support and encouraged her to call prn. ? ? ?LATCH Score ?Latch: Grasps breast easily, tongue down, lips flanged, rhythmical sucking. ? ?Audible Swallowing: A few with stimulation ? ?Type of Nipple: Everted at rest and after stimulation ? ?Comfort (Breast/Nipple): Soft / non-tender ? ?Hold (Positioning): Assistance needed to correctly position infant at breast and maintain latch. ? ?LATCH Score: 8 ? ? ?Lactation Tools Discussed/Used ?Tools: Pump;Flanges;Bottle ?Breast pump type: Double-Electric Breast Pump ?Reason for Pumping: Mom's request ?Pumping frequency: prn ?Pumped volume: 25 mL ? ?Interventions ?Interventions: Breast feeding basics reviewed;Assisted with latch;Skin to  skin;Breast massage;Hand express;Breast compression;Adjust position;Support pillows;Position options ? ?Discharge ?Discharge Education: Engorgement and breast care;Warning signs for feeding baby;Outpatient recommendation;Outpatient Epic message sent ?Pump: Personal ? ?Consult Status ?Consult Status: Complete ?Date: 01/01/22 ?Follow-up type: Out-patient ? ? ? ?Lacey Melendez ?01/01/2022, 9:47 AM ? ? ? ?

## 2022-01-04 ENCOUNTER — Encounter: Payer: Medicaid Other | Admitting: Obstetrics

## 2022-01-10 ENCOUNTER — Telehealth (HOSPITAL_COMMUNITY): Payer: Self-pay

## 2022-01-10 NOTE — Telephone Encounter (Signed)
"  Doing pretty good, feeling good. Healing wise, I'm sore on the left side of my vagina internally. I feel it more when I'm laying down." RN reviewed peri care and normal postpartum healing process with patient. RN told patient to call her OB if she doesn't feel like the soreness is improving or she is having increased pain. "I don't know who I would call. I don't have a regular OB." RN reviewed her OB-GYN's ([facility]) info with her and also told her she could come to [facility] and we are always happy to help. RN also was able to review her postpartum appointment date and time with her. Patient has no other concerns or questions about her healing. ? ?"She's latching better. She's peeing, pooping, and sleeping. She sleeps in a bassinet." RN reviewed ABC's of safe sleep with patient. Patient declines any questions or concerns about baby. ? ?EPDS score is 3. ? ?Marcelino Duster Sourish Allender,RN3,MSN,RNC-MNN ?01/10/2022,1940 ?

## 2022-01-15 ENCOUNTER — Inpatient Hospital Stay (HOSPITAL_COMMUNITY): Payer: Medicaid Other

## 2022-01-15 ENCOUNTER — Inpatient Hospital Stay (HOSPITAL_COMMUNITY)
Admission: AD | Admit: 2022-01-15 | Payer: Medicaid Other | Source: Home / Self Care | Admitting: Obstetrics & Gynecology

## 2022-02-10 ENCOUNTER — Other Ambulatory Visit (HOSPITAL_COMMUNITY)
Admission: RE | Admit: 2022-02-10 | Discharge: 2022-02-10 | Disposition: A | Discharge status: Home / Self Care | Payer: Medicaid Other | Source: Ambulatory Visit | Attending: Obstetrics | Admitting: Obstetrics

## 2022-02-10 ENCOUNTER — Ambulatory Visit (INDEPENDENT_AMBULATORY_CARE_PROVIDER_SITE_OTHER): Payer: Medicaid Other | Admitting: Obstetrics

## 2022-02-10 ENCOUNTER — Encounter: Payer: Self-pay | Admitting: Obstetrics

## 2022-02-10 DIAGNOSIS — Z3046 Encounter for surveillance of implantable subdermal contraceptive: Secondary | ICD-10-CM

## 2022-02-10 NOTE — Progress Notes (Signed)
[facility] [place] Partum Visit Note  Lacey Melendez is a 26 y.o. G63P1001 female who presents for a postpartum visit. She is 6 week postpartum following a normal spontaneous vaginal delivery.  I have fully reviewed the prenatal and intrapartum course. The delivery was at 27 gestational weeks.  Anesthesia: none. Postpartum course has been good. Baby is doing well. Baby is feeding by bottle - Similac Advance. Bleeding red. Bowel function is normal. Bladder function is normal. Patient is sexually active. Contraception method is Nexplanon. Postpartum depression screening: negative.   The pregnancy intention screening data noted above was reviewed. Potential methods of contraception were discussed. The patient elected to proceed with No data recorded.   Edinburgh Postnatal Depression Scale - 02/10/22 1113       Edinburgh Postnatal Depression Scale:  In the Past 7 Days   I have been able to laugh and see the funny side of things. 3    I have looked forward with enjoyment to things. 0    I have blamed myself unnecessarily when things went wrong. 0    I have been anxious or worried for no good reason. 2    I have felt scared or panicky for no good reason. 0    Things have been getting on top of me. 2    I have been so unhappy that I have had difficulty sleeping. 0    I have felt sad or miserable. 0    I have been so unhappy that I have been crying. 0    The thought of harming myself has occurred to me. 0    Edinburgh Postnatal Depression Scale Total 7             Health Maintenance Due  Topic Date Due   COVID-19 Vaccine (1) Never done   Hepatitis C Screening  Never done    The following portions of the patient's history were reviewed and updated as appropriate: allergies, current medications, past family history, past medical history, past social history, past surgical history, and problem list.  Review of Systems A comprehensive review of systems was negative.  Objective:  BP 108/71    Pulse 74   Ht 5\' 5"  (1.651 m)   Wt 142 lb 4.8 oz (64.5 kg)   LMP 02/08/2022 Comment: Nexplanon  Breastfeeding No   BMI 23.68 kg/m    General:  alert and no distress   Breasts:  normal  Lungs: clear to auscultation bilaterally  Heart:  regular rate and rhythm, S1, S2 normal, no murmur, click, rub or gallop  Abdomen: soft, non-tender; bowel sounds normal; no masses,  no organomegaly   Wound none  GU exam:  not indicated       Assessment:    1. Postpartum care following vaginal delivery Rx: - Cervicovaginal ancillary only( [place])  2. Encounter for surveillance of implantable subdermal contraceptive - doing well    Plan:   Essential components of care per ACOG recommendations:  1.  Mood and well being: Patient with negative depression screening today. Reviewed local resources for support.  - Patient tobacco use? No.   - hx of drug use? Yes. Discussed support systems and outpatient/inpatient treatment options.    2. Infant care and feeding:  -Patient currently breastmilk feeding? No.  -Social determinants of health (SDOH) reviewed in EPIC. No concerns  3. Sexuality, contraception and birth spacing - Patient does not want a pregnancy in the next year.  Desired family size is unknown  -  Reviewed reproductive life planning. Reviewed contraceptive methods based on pt preferences and effectiveness.  Patient desired Hormonal Implant today.   - Discussed birth spacing of 18 months  4. Sleep and fatigue -Encouraged family/partner/community support of 4 hrs of uninterrupted sleep to help with mood and fatigue  5. Physical Recovery  - Discussed patients delivery and complications. She describes her labor as good. - Patient had a Vaginal, no problems at delivery. Patient had a 1st degree laceration. Perineal healing reviewed. Patient expressed understanding - Patient has urinary incontinence? No. - Patient is safe to resume physical and sexual activity  6.  Health  Maintenance - HM due items addressed Yes - Last pap smear  Diagnosis  Date Value Ref Range Status  08/02/2021   Final   - Negative for intraepithelial lesion or malignancy (NILM)   Pap smear not done at today's visit.  -Breast Cancer screening indicated? No.   7. Chronic Disease/Pregnancy Condition follow up: None   Baltazar Najjar, [place] [facility], [place], [place] 02/10/22

## 2022-02-13 LAB — CERVICOVAGINAL ANCILLARY ONLY
Bacterial Vaginitis (gardnerella): POSITIVE — AB
Candida Glabrata: NEGATIVE
Candida Vaginitis: NEGATIVE
Chlamydia: NEGATIVE
Comment: NEGATIVE
Comment: NEGATIVE
Comment: NEGATIVE
Comment: NEGATIVE
Comment: NEGATIVE
Comment: NORMAL
Neisseria Gonorrhea: NEGATIVE
Trichomonas: NEGATIVE

## 2022-02-14 ENCOUNTER — Other Ambulatory Visit: Payer: Self-pay | Admitting: Obstetrics

## 2022-02-14 ENCOUNTER — Encounter: Payer: Self-pay | Admitting: Emergency Medicine

## 2022-02-14 DIAGNOSIS — B9689 Other specified bacterial agents as the cause of diseases classified elsewhere: Secondary | ICD-10-CM

## 2022-02-14 MED ORDER — METRONIDAZOLE 500 MG PO TABS: 500.0000 mg | ORAL_TABLET | Freq: Two times a day (BID) | ORAL | 2 refills | Status: AC

## 2022-03-10 ENCOUNTER — Ambulatory Visit: Payer: Medicaid Other | Admitting: Obstetrics

## 2023-02-08 IMAGING — US US MFM OB FOLLOW-UP
1 series · 14 of 28 positions shown · non-contrast
Comparison: none

[Series 1: us mfm ob follow-up · 83 acquisitions, 14 frames shown]
[im 4/83]
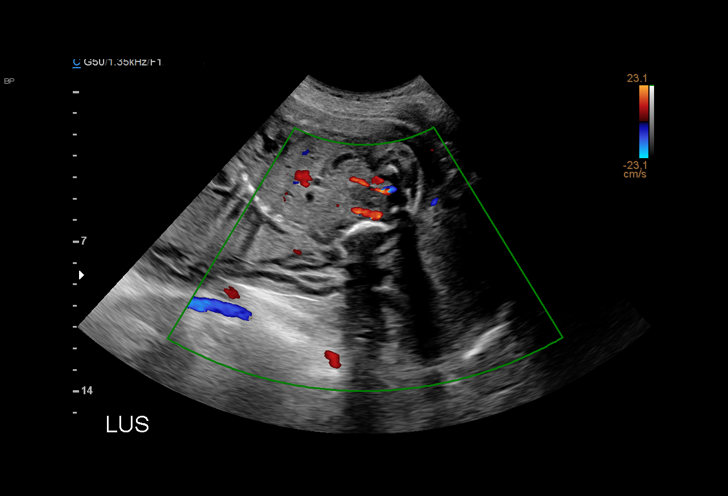
[im 10/83]
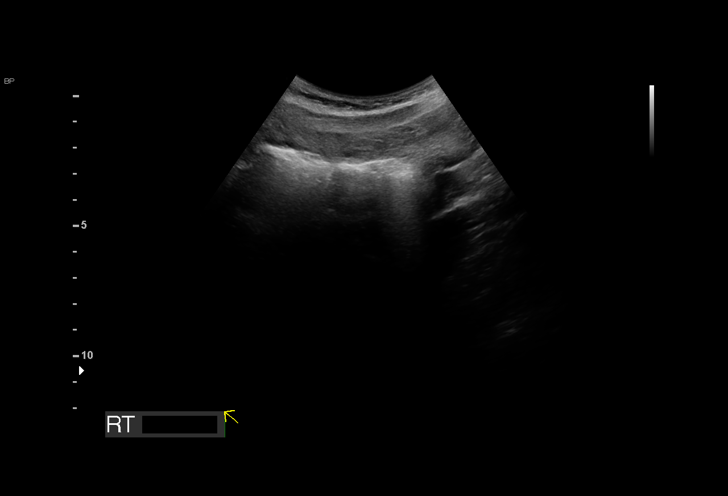
[im 16/83]
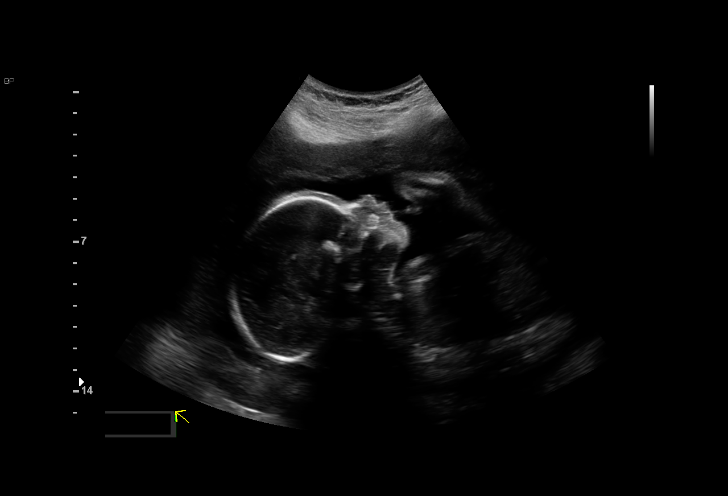
[im 22/83]
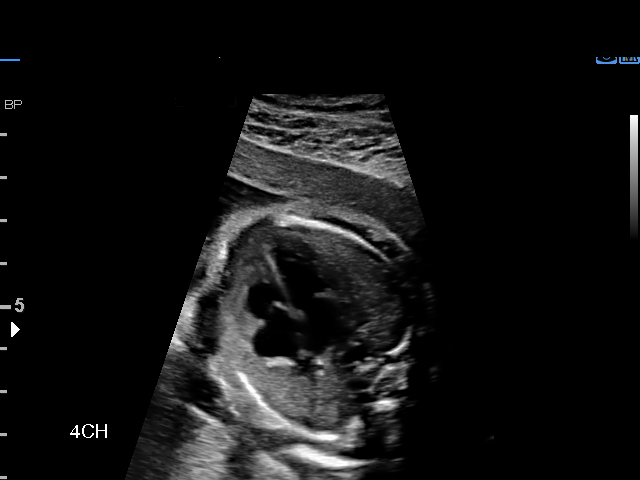
[im 28/83]
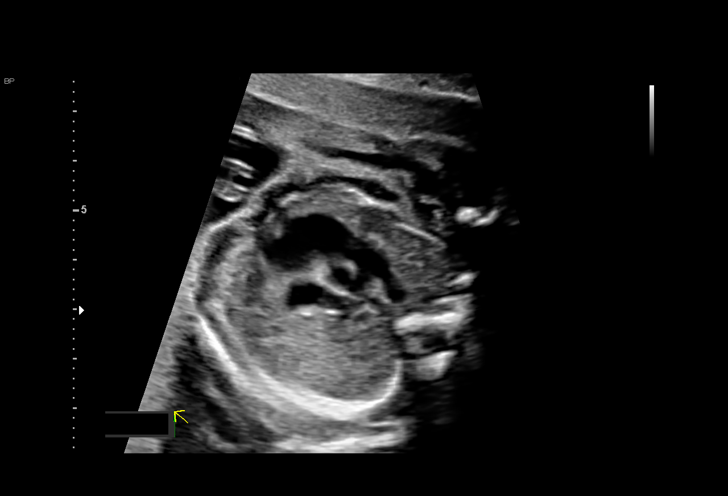
[im 34/83]
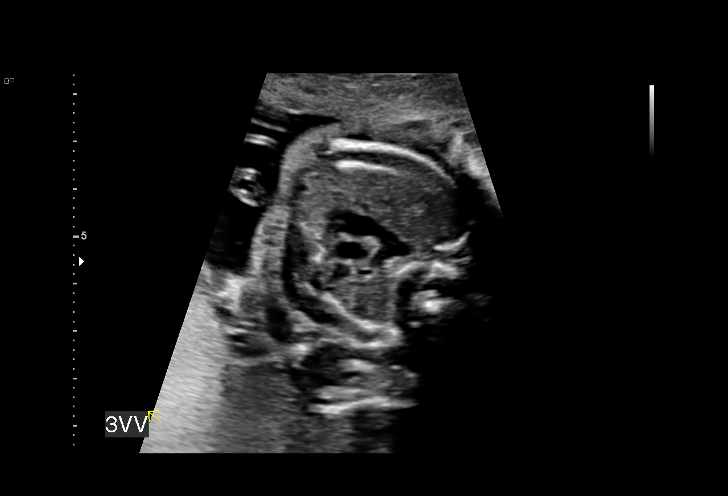
[im 40/83]
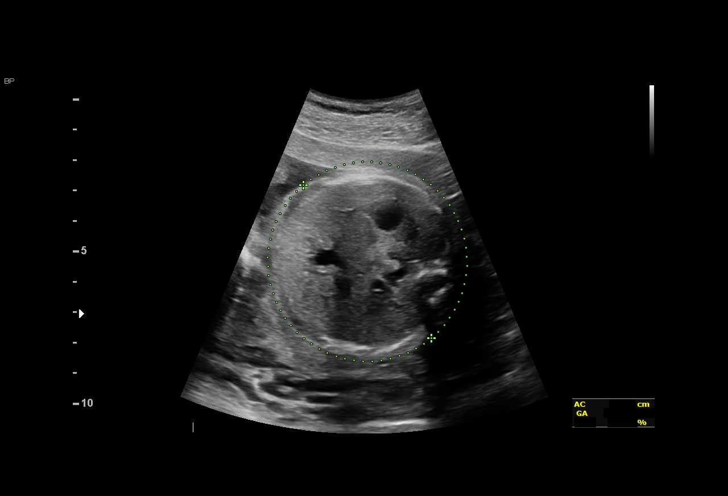
[im 46/83]
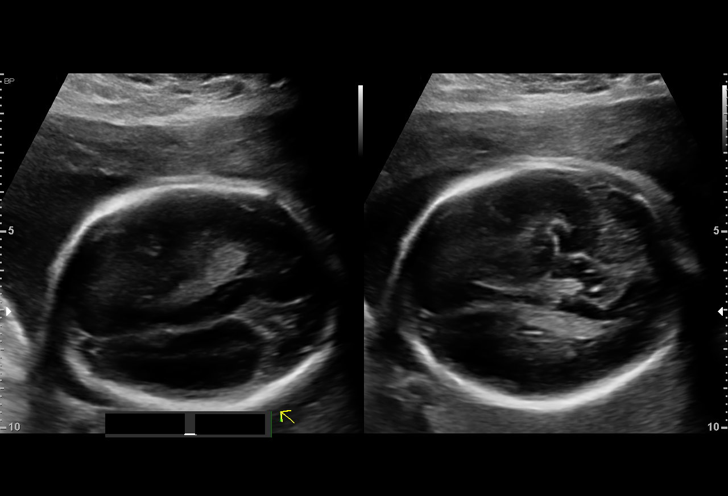
[im 52/83]
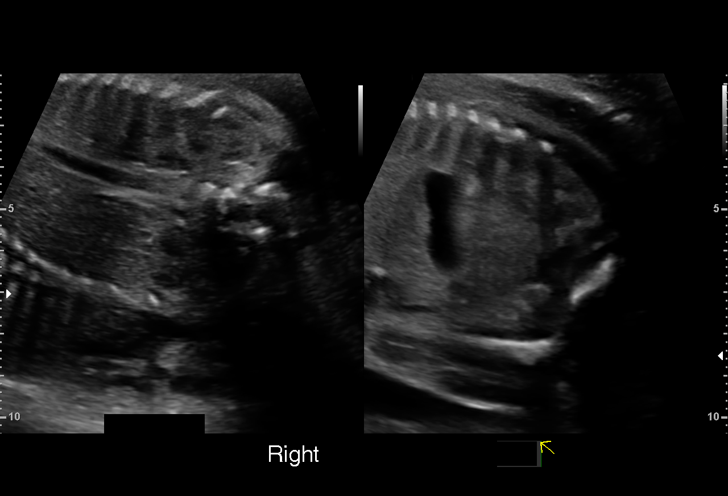
[im 58/83]
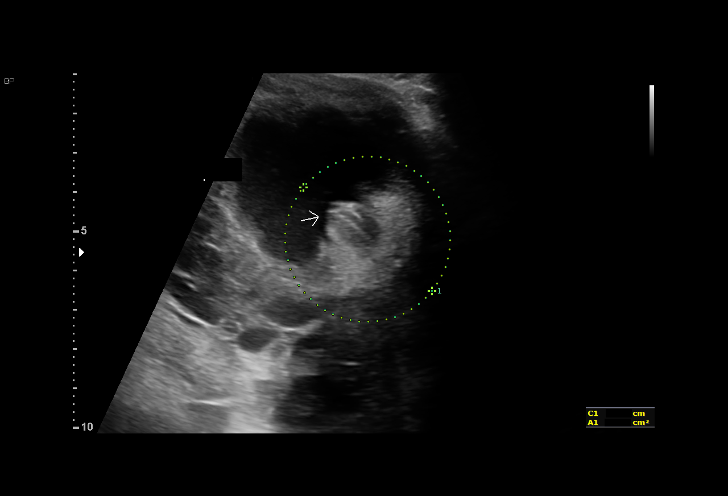
[im 64/83]
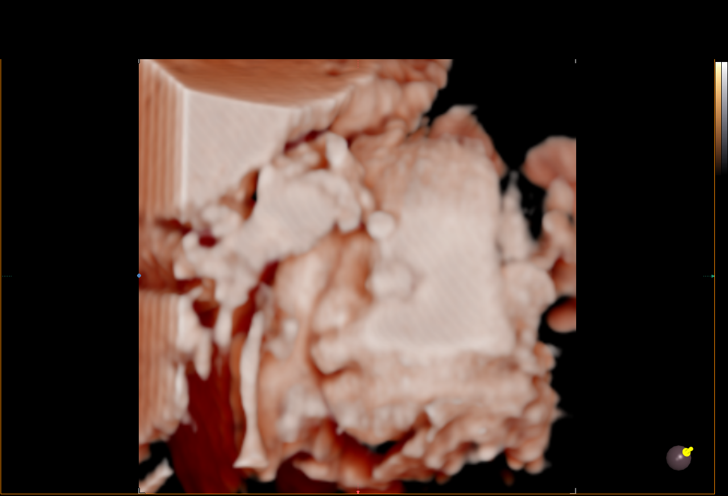
[im 70/83]
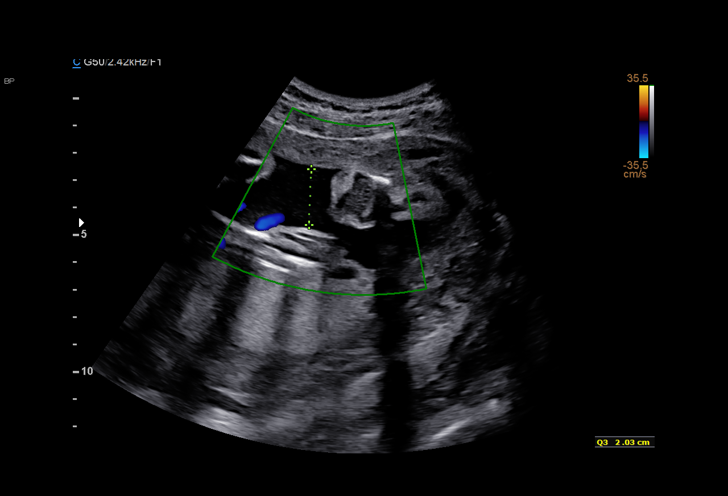
[im 76/83]
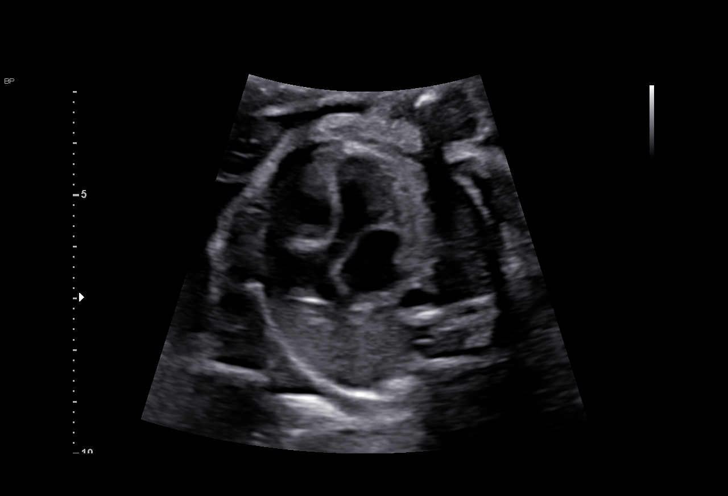
[im 83/83]
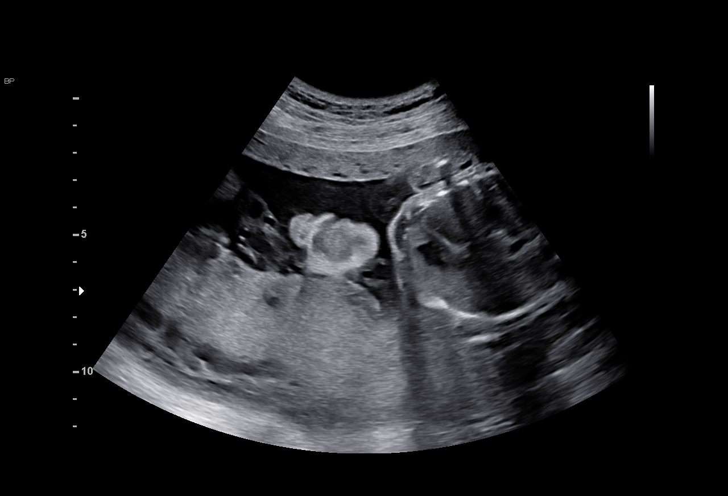

[14 of 28 positions shown; findings below may reference images not displayed]

Indications

 Drug use complicating pregnancy, second
 trimester
 Declined Panorama
 24 weeks gestation of pregnancy
Fetal Evaluation

 Num Of Fetuses:         1
 Fetal Heart Rate(bpm):  150
 Cardiac Activity:       Observed
 Presentation:           Breech
 Placenta:               Posterior
 P. Cord Insertion:      Visualized, central

 Amniotic Fluid
 AFI FV:      Within normal limits

 AFI Sum(cm)     %Tile       Largest Pocket(cm)
 11.32           19

 RUQ(cm)       RLQ(cm)       LUQ(cm)        LLQ(cm)

Biometry

 BPD:      58.8  mm     G. Age:  24w 0d         24  %    CI:        70.64   %    70 - 86
                                                         FL/HC:      20.6   %    18.7 -
 HC:       223   mm     G. Age:  24w 2d         22  %    HC/AC:      1.09        1.04 -
 AC:      204.4  mm     G. Age:  25w 0d         56  %    FL/BPD:     78.1   %    71 - 87
 FL:       45.9  mm     G. Age:  25w 2d         58  %    FL/AC:      22.5   %    20 - 24
 HUM:      43.5  mm     G. Age:  25w 6d         77  %
 CER:      29.2  mm     G. Age:  25w 4d         85  %
 LV:        1.9  mm
 CM:        3.9  mm

 Est. FW:     756  gm    1 lb 11 oz      59  %
OB History

 Gravidity:    1
 Living:       0
Gestational Age

 LMP:           26w 2d        Date:  03/22/21                 EDD:   12/27/21
 U/S Today:     24w 5d                                        EDD:   01/07/22
 Best:          24w 4d     Det. By:  U/S  (08/24/21)          EDD:   01/08/22
Anatomy

 Cranium:               Appears normal         LVOT:                   Appears normal
 Cavum:                 Appears normal         Aortic Arch:            Appears normal
 Ventricles:            Appears normal         Ductal Arch:            Appears normal
 Choroid Plexus:        Appears normal         Diaphragm:              Appears normal
 Cerebellum:            Appears normal         Stomach:                Appears normal, left
                                                                       sided
 Posterior Fossa:       Appears normal         Abdomen:                Appears normal
 Nuchal Fold:           Not applicable (>20    Abdominal Wall:         Previously seen
                        wks GA)
 Face:                  Appears normal         Cord Vessels:           Appears normal (3
                        (orbits and profile)                           vessel cord)
 Lips:                  Appears normal         Kidneys:                Appear normal
 Palate:                Previously seen        Bladder:                Appears normal
 Thoracic:              Appears normal         Spine:                  Previously seen
 Heart:                 Appears normal         Upper Extremities:      Previously seen
                        (4CH, axis, and
                        situs)
 RVOT:                  Appears normal         Lower Extremities:      Previously seen

 Other:  3vv and 3vt visualized. Heels/feet and open hands/5th digits, Lenses,
         nasal bone previously visualized. Fetus appears to be female.
Cervix Uterus Adnexa

 Cervix
 Not visualized (advanced GA >70wks)

 Uterus
 Normal shape and size.

 Right Ovary
 Not visualized.

 Left Ovary
 Not visualized.

 Cul De Sac
 No free fluid seen.
 Adnexa
 No adnexal mass visualized.
Comments

 This patient was seen for a follow up growth scan to confirm
 her dates.  She denies any problems since her last exam.
 She was informed that the fetal growth and amniotic fluid
 level appears appropriate for her gestational age.  The fetal
 biometry measurements obtained today confirms her EDC January 08, 2022.
 As the fetal growth is within normal limits, no further exams
 were scheduled in our office.
